# Patient Record
Sex: Female | Born: 1960 | Race: Black or African American | Hispanic: No | Marital: Married | Smoking: Never smoker
Health system: Southern US, Community
[De-identification: ages and names within clinical notes are randomized; demographics above are authoritative.]

## PROBLEM LIST (undated history)

## (undated) DIAGNOSIS — C801 Malignant (primary) neoplasm, unspecified: Secondary | ICD-10-CM

## (undated) DIAGNOSIS — F329 Major depressive disorder, single episode, unspecified: Secondary | ICD-10-CM

## (undated) DIAGNOSIS — I10 Essential (primary) hypertension: Secondary | ICD-10-CM

## (undated) DIAGNOSIS — F419 Anxiety disorder, unspecified: Secondary | ICD-10-CM

## (undated) DIAGNOSIS — F32A Depression, unspecified: Secondary | ICD-10-CM

## (undated) DIAGNOSIS — M1990 Unspecified osteoarthritis, unspecified site: Secondary | ICD-10-CM

## (undated) HISTORY — DX: Malignant (primary) neoplasm, unspecified: C80.1

## (undated) HISTORY — PX: CERVICAL CERCLAGE: SHX1329

## (undated) HISTORY — DX: Essential (primary) hypertension: I10

---

## 1998-05-25 ENCOUNTER — Encounter: Payer: Self-pay | Admitting: Gynecology

## 1998-05-25 ENCOUNTER — Ambulatory Visit (HOSPITAL_COMMUNITY): Admission: RE | Admit: 1998-05-25 | Discharge: 1998-05-25 | Payer: Self-pay | Admitting: Gynecology

## 1998-06-04 ENCOUNTER — Encounter: Payer: Self-pay | Admitting: Gynecology

## 1998-06-04 ENCOUNTER — Ambulatory Visit (HOSPITAL_COMMUNITY): Admission: RE | Admit: 1998-06-04 | Discharge: 1998-06-04 | Payer: Self-pay | Admitting: Gynecology

## 1998-06-05 ENCOUNTER — Ambulatory Visit (HOSPITAL_COMMUNITY): Admission: RE | Admit: 1998-06-05 | Discharge: 1998-06-05 | Payer: Self-pay | Admitting: *Deleted

## 1998-06-09 HISTORY — PX: BREAST SURGERY: SHX581

## 1998-06-13 ENCOUNTER — Encounter: Admission: RE | Admit: 1998-06-13 | Discharge: 1998-09-11 | Payer: Self-pay | Admitting: Radiation Oncology

## 1998-06-27 ENCOUNTER — Ambulatory Visit (HOSPITAL_COMMUNITY): Admission: RE | Admit: 1998-06-27 | Discharge: 1998-06-28 | Payer: Self-pay | Admitting: *Deleted

## 1998-08-02 ENCOUNTER — Ambulatory Visit (HOSPITAL_COMMUNITY): Admission: RE | Admit: 1998-08-02 | Discharge: 1998-08-02 | Payer: Self-pay | Admitting: *Deleted

## 1998-08-08 ENCOUNTER — Ambulatory Visit (HOSPITAL_COMMUNITY): Admission: RE | Admit: 1998-08-08 | Discharge: 1998-08-08 | Payer: Self-pay | Admitting: *Deleted

## 1998-10-23 ENCOUNTER — Encounter: Payer: Self-pay | Admitting: Hematology & Oncology

## 1998-10-23 ENCOUNTER — Emergency Department (HOSPITAL_COMMUNITY): Admission: EM | Admit: 1998-10-23 | Discharge: 1998-10-23 | Payer: Self-pay | Admitting: Emergency Medicine

## 1998-11-07 ENCOUNTER — Encounter: Admission: RE | Admit: 1998-11-07 | Discharge: 1999-02-05 | Payer: Self-pay | Admitting: Radiation Oncology

## 1998-11-14 ENCOUNTER — Ambulatory Visit (HOSPITAL_BASED_OUTPATIENT_CLINIC_OR_DEPARTMENT_OTHER): Admission: RE | Admit: 1998-11-14 | Discharge: 1998-11-14 | Payer: Self-pay | Admitting: *Deleted

## 1999-10-04 ENCOUNTER — Encounter: Payer: Self-pay | Admitting: Hematology & Oncology

## 1999-10-04 ENCOUNTER — Encounter: Admission: RE | Admit: 1999-10-04 | Discharge: 1999-10-04 | Payer: Self-pay | Admitting: Hematology & Oncology

## 2000-10-05 ENCOUNTER — Encounter: Payer: Self-pay | Admitting: Hematology & Oncology

## 2000-10-05 ENCOUNTER — Encounter: Admission: RE | Admit: 2000-10-05 | Discharge: 2000-10-05 | Payer: Self-pay | Admitting: Hematology & Oncology

## 2001-01-06 ENCOUNTER — Encounter: Payer: Self-pay | Admitting: Hematology & Oncology

## 2001-01-06 ENCOUNTER — Ambulatory Visit (HOSPITAL_COMMUNITY): Admission: RE | Admit: 2001-01-06 | Discharge: 2001-01-06 | Payer: Self-pay | Admitting: Hematology & Oncology

## 2001-06-28 ENCOUNTER — Encounter: Admission: RE | Admit: 2001-06-28 | Discharge: 2001-06-28 | Payer: Self-pay | Admitting: Hematology & Oncology

## 2001-06-28 ENCOUNTER — Encounter: Payer: Self-pay | Admitting: Hematology & Oncology

## 2001-10-06 ENCOUNTER — Encounter: Admission: RE | Admit: 2001-10-06 | Discharge: 2001-10-06 | Payer: Self-pay | Admitting: Hematology & Oncology

## 2001-10-06 ENCOUNTER — Encounter: Payer: Self-pay | Admitting: Hematology & Oncology

## 2001-12-17 ENCOUNTER — Other Ambulatory Visit: Admission: RE | Admit: 2001-12-17 | Discharge: 2001-12-17 | Payer: Self-pay | Admitting: Gynecology

## 2002-03-01 ENCOUNTER — Encounter: Payer: Self-pay | Admitting: Gynecology

## 2002-03-01 ENCOUNTER — Ambulatory Visit (HOSPITAL_COMMUNITY): Admission: RE | Admit: 2002-03-01 | Discharge: 2002-03-01 | Payer: Self-pay | Admitting: Gynecology

## 2002-10-10 ENCOUNTER — Encounter: Payer: Self-pay | Admitting: Hematology & Oncology

## 2002-10-10 ENCOUNTER — Encounter: Admission: RE | Admit: 2002-10-10 | Discharge: 2002-10-10 | Payer: Self-pay | Admitting: Hematology & Oncology

## 2003-02-22 ENCOUNTER — Other Ambulatory Visit: Admission: RE | Admit: 2003-02-22 | Discharge: 2003-02-22 | Payer: Self-pay | Admitting: Gynecology

## 2003-06-15 ENCOUNTER — Emergency Department (HOSPITAL_COMMUNITY): Admission: EM | Admit: 2003-06-15 | Discharge: 2003-06-15 | Payer: Self-pay

## 2003-10-12 ENCOUNTER — Encounter: Admission: RE | Admit: 2003-10-12 | Discharge: 2003-10-12 | Payer: Self-pay | Admitting: Hematology & Oncology

## 2004-04-12 ENCOUNTER — Other Ambulatory Visit: Admission: RE | Admit: 2004-04-12 | Discharge: 2004-04-12 | Payer: Self-pay | Admitting: Gynecology

## 2004-10-18 ENCOUNTER — Encounter: Admission: RE | Admit: 2004-10-18 | Discharge: 2004-10-18 | Payer: Self-pay | Admitting: Hematology & Oncology

## 2004-12-09 ENCOUNTER — Ambulatory Visit: Payer: Self-pay | Admitting: Hematology & Oncology

## 2005-06-27 ENCOUNTER — Ambulatory Visit: Payer: Self-pay | Admitting: Hematology & Oncology

## 2005-06-27 ENCOUNTER — Other Ambulatory Visit: Admission: RE | Admit: 2005-06-27 | Discharge: 2005-06-27 | Payer: Self-pay | Admitting: Gynecology

## 2005-09-10 ENCOUNTER — Ambulatory Visit: Payer: Self-pay | Admitting: Hematology & Oncology

## 2005-09-11 LAB — CBC WITH DIFFERENTIAL/PLATELET
Basophils Absolute: 0 10*3/uL (ref 0.0–0.1)
Eosinophils Absolute: 0.1 10*3/uL (ref 0.0–0.5)
HGB: 12.5 g/dL (ref 11.6–15.9)
MONO#: 0.5 10*3/uL (ref 0.1–0.9)
MONO%: 7.1 % (ref 0.0–13.0)
NEUT#: 4.2 10*3/uL (ref 1.5–6.5)
RBC: 4.41 10*6/uL (ref 3.70–5.32)
RDW: 14.3 % (ref 11.3–14.5)
WBC: 6.6 10*3/uL (ref 3.9–10.0)
lymph#: 1.8 10*3/uL (ref 0.9–3.3)

## 2005-09-11 LAB — COMPREHENSIVE METABOLIC PANEL
Albumin: 3.8 g/dL (ref 3.5–5.2)
Alkaline Phosphatase: 103 U/L (ref 39–117)
BUN: 10 mg/dL (ref 6–23)
Calcium: 9.4 mg/dL (ref 8.4–10.5)
Chloride: 101 mEq/L (ref 96–112)
Glucose, Bld: 94 mg/dL (ref 70–99)
Potassium: 3.9 mEq/L (ref 3.5–5.3)
Sodium: 140 mEq/L (ref 135–145)
Total Protein: 7.5 g/dL (ref 6.0–8.3)

## 2005-10-22 ENCOUNTER — Encounter: Admission: RE | Admit: 2005-10-22 | Discharge: 2005-10-22 | Payer: Self-pay | Admitting: Hematology & Oncology

## 2006-07-07 ENCOUNTER — Other Ambulatory Visit: Admission: RE | Admit: 2006-07-07 | Discharge: 2006-07-07 | Payer: Self-pay | Admitting: Gynecology

## 2006-09-07 ENCOUNTER — Ambulatory Visit: Payer: Self-pay | Admitting: Hematology & Oncology

## 2006-09-10 LAB — COMPREHENSIVE METABOLIC PANEL
AST: 13 U/L (ref 0–37)
Albumin: 3.9 g/dL (ref 3.5–5.2)
Alkaline Phosphatase: 114 U/L (ref 39–117)
Calcium: 8.9 mg/dL (ref 8.4–10.5)
Chloride: 102 mEq/L (ref 96–112)
Potassium: 3.5 mEq/L (ref 3.5–5.3)
Sodium: 139 mEq/L (ref 135–145)
Total Protein: 7.5 g/dL (ref 6.0–8.3)

## 2006-09-10 LAB — CBC WITH DIFFERENTIAL/PLATELET
EOS%: 1.9 % (ref 0.0–7.0)
Eosinophils Absolute: 0.2 10*3/uL (ref 0.0–0.5)
HGB: 12.7 g/dL (ref 11.6–15.9)
MCH: 28.2 pg (ref 26.0–34.0)
MCV: 82.4 fL (ref 81.0–101.0)
MONO%: 6.2 % (ref 0.0–13.0)
NEUT#: 5.6 10*3/uL (ref 1.5–6.5)
RBC: 4.49 10*6/uL (ref 3.70–5.32)
RDW: 15 % — ABNORMAL HIGH (ref 11.3–14.5)
lymph#: 1.7 10*3/uL (ref 0.9–3.3)

## 2006-10-26 ENCOUNTER — Encounter: Admission: RE | Admit: 2006-10-26 | Discharge: 2006-10-26 | Payer: Self-pay | Admitting: Hematology & Oncology

## 2007-04-20 ENCOUNTER — Encounter: Admission: RE | Admit: 2007-04-20 | Discharge: 2007-04-20 | Payer: Self-pay | Admitting: Hematology & Oncology

## 2007-04-20 ENCOUNTER — Encounter (INDEPENDENT_AMBULATORY_CARE_PROVIDER_SITE_OTHER): Payer: Self-pay | Admitting: Diagnostic Radiology

## 2007-05-24 ENCOUNTER — Encounter (INDEPENDENT_AMBULATORY_CARE_PROVIDER_SITE_OTHER): Payer: Self-pay | Admitting: Surgery

## 2007-05-24 ENCOUNTER — Encounter: Admission: RE | Admit: 2007-05-24 | Discharge: 2007-05-24 | Payer: Self-pay | Admitting: Surgery

## 2007-05-24 ENCOUNTER — Ambulatory Visit (HOSPITAL_COMMUNITY): Admission: RE | Admit: 2007-05-24 | Discharge: 2007-05-24 | Payer: Self-pay | Admitting: Surgery

## 2007-07-21 ENCOUNTER — Other Ambulatory Visit: Admission: RE | Admit: 2007-07-21 | Discharge: 2007-07-21 | Payer: Self-pay | Admitting: Gynecology

## 2007-09-07 ENCOUNTER — Ambulatory Visit: Payer: Self-pay | Admitting: Hematology & Oncology

## 2007-09-09 LAB — COMPREHENSIVE METABOLIC PANEL
Albumin: 3.8 g/dL (ref 3.5–5.2)
Alkaline Phosphatase: 95 U/L (ref 39–117)
BUN: 11 mg/dL (ref 6–23)
CO2: 28 mEq/L (ref 19–32)
Calcium: 9.1 mg/dL (ref 8.4–10.5)
Glucose, Bld: 96 mg/dL (ref 70–99)
Potassium: 3.7 mEq/L (ref 3.5–5.3)
Sodium: 139 mEq/L (ref 135–145)
Total Protein: 7.2 g/dL (ref 6.0–8.3)

## 2007-09-09 LAB — CBC WITH DIFFERENTIAL/PLATELET
Basophils Absolute: 0 10*3/uL (ref 0.0–0.1)
Eosinophils Absolute: 0.1 10*3/uL (ref 0.0–0.5)
HGB: 12.4 g/dL (ref 11.6–15.9)
MCV: 82.8 fL (ref 81.0–101.0)
MONO#: 0.4 10*3/uL (ref 0.1–0.9)
MONO%: 5 % (ref 0.0–13.0)
NEUT#: 5.4 10*3/uL (ref 1.5–6.5)
Platelets: 335 10*3/uL (ref 145–400)
RBC: 4.41 10*6/uL (ref 3.70–5.32)
RDW: 14.1 % (ref 11.3–14.5)
WBC: 8 10*3/uL (ref 3.9–10.0)

## 2007-11-05 ENCOUNTER — Encounter: Admission: RE | Admit: 2007-11-05 | Discharge: 2007-11-05 | Payer: Self-pay | Admitting: Internal Medicine

## 2008-09-06 ENCOUNTER — Ambulatory Visit: Payer: Self-pay | Admitting: Hematology & Oncology

## 2008-09-18 ENCOUNTER — Other Ambulatory Visit: Admission: RE | Admit: 2008-09-18 | Discharge: 2008-09-18 | Payer: Self-pay | Admitting: Gynecology

## 2008-09-18 ENCOUNTER — Encounter: Payer: Self-pay | Admitting: Gynecology

## 2008-09-18 ENCOUNTER — Ambulatory Visit: Payer: Self-pay | Admitting: Gynecology

## 2009-02-15 ENCOUNTER — Encounter: Admission: RE | Admit: 2009-02-15 | Discharge: 2009-02-15 | Payer: Self-pay | Admitting: Gynecology

## 2009-10-31 ENCOUNTER — Ambulatory Visit: Payer: Self-pay | Admitting: Gynecology

## 2009-10-31 ENCOUNTER — Other Ambulatory Visit: Admission: RE | Admit: 2009-10-31 | Discharge: 2009-10-31 | Payer: Self-pay | Admitting: Gynecology

## 2010-03-14 ENCOUNTER — Encounter: Admission: RE | Admit: 2010-03-14 | Discharge: 2010-03-14 | Payer: Self-pay | Admitting: Internal Medicine

## 2010-03-26 ENCOUNTER — Encounter: Admission: RE | Admit: 2010-03-26 | Discharge: 2010-03-26 | Payer: Self-pay | Admitting: Internal Medicine

## 2010-04-09 ENCOUNTER — Encounter: Admission: RE | Admit: 2010-04-09 | Discharge: 2010-04-09 | Payer: Self-pay | Admitting: Internal Medicine

## 2010-04-17 ENCOUNTER — Ambulatory Visit: Payer: Self-pay | Admitting: Hematology & Oncology

## 2010-04-22 ENCOUNTER — Ambulatory Visit: Payer: Self-pay | Admitting: Genetic Counselor

## 2010-05-09 HISTORY — PX: MASTECTOMY: SHX3

## 2010-05-23 ENCOUNTER — Encounter (INDEPENDENT_AMBULATORY_CARE_PROVIDER_SITE_OTHER): Payer: Self-pay | Admitting: Surgery

## 2010-05-23 ENCOUNTER — Ambulatory Visit (HOSPITAL_COMMUNITY)
Admission: RE | Admit: 2010-05-23 | Discharge: 2010-05-25 | Payer: Self-pay | Source: Home / Self Care | Attending: Surgery | Admitting: Surgery

## 2010-06-29 ENCOUNTER — Encounter: Payer: Self-pay | Admitting: Internal Medicine

## 2010-06-30 ENCOUNTER — Encounter: Payer: Self-pay | Admitting: Internal Medicine

## 2010-07-01 ENCOUNTER — Encounter: Payer: Self-pay | Admitting: Gynecology

## 2010-07-18 ENCOUNTER — Encounter (HOSPITAL_BASED_OUTPATIENT_CLINIC_OR_DEPARTMENT_OTHER): Payer: Managed Care, Other (non HMO) | Admitting: Hematology & Oncology

## 2010-07-18 DIAGNOSIS — D0590 Unspecified type of carcinoma in situ of unspecified breast: Secondary | ICD-10-CM

## 2010-07-18 DIAGNOSIS — Z853 Personal history of malignant neoplasm of breast: Secondary | ICD-10-CM

## 2010-08-20 LAB — SURGICAL PCR SCREEN: Staphylococcus aureus: NEGATIVE

## 2010-08-20 LAB — COMPREHENSIVE METABOLIC PANEL
ALT: 9 U/L (ref 0–35)
AST: 13 U/L (ref 0–37)
Albumin: 3.8 g/dL (ref 3.5–5.2)
Alkaline Phosphatase: 132 U/L — ABNORMAL HIGH (ref 39–117)
BUN: 8 mg/dL (ref 6–23)
CO2: 32 mEq/L (ref 19–32)
Calcium: 9.5 mg/dL (ref 8.4–10.5)
Chloride: 99 mEq/L (ref 96–112)
Creatinine, Ser: 0.82 mg/dL (ref 0.4–1.2)
GFR calc Af Amer: >60 mL/min (ref >60)
GFR calc non Af Amer: >60 mL/min (ref >60)
Glucose, Bld: 91 mg/dL (ref 70–99)
Potassium: 3.8 mEq/L (ref 3.5–5.1)
Sodium: 138 mEq/L (ref 135–145)
Total Bilirubin: 0.6 mg/dL (ref 0.3–1.2)
Total Protein: 8.2 g/dL (ref 6.0–8.3)

## 2010-08-20 LAB — CBC
HCT: 39.8 % (ref 36.0–46.0)
Hemoglobin: 12.6 g/dL (ref 12.0–15.0)
MCH: 27.1 pg (ref 26.0–34.0)
MCHC: 31.7 g/dL (ref 30.0–36.0)
MCV: 85.6 fL (ref 78.0–100.0)
Platelets: 328 10*3/uL (ref 150–400)
RBC: 4.65 MIL/uL (ref 3.87–5.11)
RDW: 13.3 % (ref 11.5–15.5)
WBC: 7.1 10*3/uL (ref 4.0–10.5)

## 2010-08-20 LAB — DIFFERENTIAL
Basophils Absolute: 0 10*3/uL (ref 0.0–0.1)
Basophils Relative: 0 % (ref 0–1)
Eosinophils Absolute: 0.1 10*3/uL (ref 0.0–0.7)
Eosinophils Relative: 1 % (ref 0–5)
Lymphocytes Relative: 38 % (ref 12–46)
Lymphs Abs: 2.7 10*3/uL (ref 0.7–4.0)
Monocytes Absolute: 0.3 10*3/uL (ref 0.1–1.0)
Monocytes Relative: 5 % (ref 3–12)
Neutro Abs: 4 10*3/uL (ref 1.7–7.7)
Neutrophils Relative %: 56 % (ref 43–77)

## 2010-08-20 LAB — HCG, SERUM, QUALITATIVE: Preg, Serum: NEGATIVE

## 2010-10-22 NOTE — Op Note (Signed)
Diane Barnes, Diane Barnes                ACCOUNT NO.:  [id number]   MEDICAL RECORD NO.:  [id number]          PATIENT TYPE:  AMB   LOCATION:  SDS                          FACILITY:  [facility]   PHYSICIAN:  Thomas A. Cornett, M.D.DATE OF BIRTH:  [birth year]   DATE OF PROCEDURE:  05/24/2007  DATE OF DISCHARGE:                               OPERATIVE REPORT   PREOPERATIVE DIAGNOSIS:  Right breast atypical ductal hyperplasia.   POSTOPERATIVE DIAGNOSIS:  Right breast atypical ductal hyperplasia.   PROCEDURE:  Right breast needle-localized excisional biopsy.   SURGEON:  Harriette Bouillon, M.D.   ASSISTANT:  None.   ANESTHESIA:  MAC with 0.25% Sensorcaine with epinephrine for local  anesthesia.   ESTIMATED BLOOD LOSS:  20 mL.   SPECIMEN:  Right breast tissue with localizing wire.  Previously placed  biopsy clip found to be adequate by specimen radiograph.   INDICATIONS FOR PROCEDURE:  The patient is a 50 year old female who had  a right breast biopsy by percutaneous means.  The pathology came back  with atypical ductal hyperplasia and excisional biopsy recommended.   DESCRIPTION OF PROCEDURE:  After undergoing right breast needle  localization, the patient brought to the operating room, placed supine.  After induction of MAC anesthesia the right breast was prepped and  draped in sterile fashion.  The wire exited the upper central portion of  the breast.  0.25% Sensorcaine was infiltrated into this area and a  incision was made in the upper central breast approximately two  fingerbreadths above the nipple areolar complex.  Localizing wire was  pulled out of the incision.  Tissue around the tip of the wire was  excised and the entire specimen was sent to radiography for image.  The  image showed the tissue to be adequate with the entire wire  and clip within the specimen.  Irrigation was used and suctioned out.  The wound was closed in layers using a deep layer of 3-0 Vicryl and 4-0  Monocryl in  a subcuticular stitch.  Steri-Strips and dry dressings were  applied.  The patient was awoke and taken to recovery in satisfactory  condition.      Thomas A. Cornett, M.D.  Electronically Signed     TAC/MEDQ  D:  05/24/2007  T:  05/25/2007  Job:  [id number]   cc:   Dr. Aloha Gell. Card  Oliver Hum, MD

## 2010-11-05 ENCOUNTER — Encounter: Payer: Managed Care, Other (non HMO) | Admitting: Gynecology

## 2010-11-28 ENCOUNTER — Other Ambulatory Visit (HOSPITAL_COMMUNITY)
Admission: RE | Admit: 2010-11-28 | Discharge: 2010-11-28 | Disposition: A | Discharge status: Home / Self Care | Payer: Managed Care, Other (non HMO) | Source: Ambulatory Visit | Attending: Gynecology | Admitting: Gynecology

## 2010-11-28 ENCOUNTER — Other Ambulatory Visit: Payer: Self-pay | Admitting: Gynecology

## 2010-11-28 ENCOUNTER — Encounter (INDEPENDENT_AMBULATORY_CARE_PROVIDER_SITE_OTHER): Payer: Managed Care, Other (non HMO) | Admitting: Gynecology

## 2010-11-28 DIAGNOSIS — N926 Irregular menstruation, unspecified: Secondary | ICD-10-CM

## 2010-11-28 DIAGNOSIS — Z124 Encounter for screening for malignant neoplasm of cervix: Secondary | ICD-10-CM | POA: Insufficient documentation

## 2010-11-28 DIAGNOSIS — Z01419 Encounter for gynecological examination (general) (routine) without abnormal findings: Secondary | ICD-10-CM

## 2011-03-17 LAB — DIFFERENTIAL
Basophils Absolute: 0
Eosinophils Relative: 2
Lymphocytes Relative: 23
Neutro Abs: 6.2
Neutrophils Relative %: 70

## 2011-03-17 LAB — BASIC METABOLIC PANEL
BUN: 11
Calcium: 9.2
Creatinine, Ser: 0.86
GFR calc non Af Amer: >60
Glucose, Bld: 92

## 2011-03-17 LAB — PROTIME-INR
INR: 0.9
Prothrombin Time: 12.3

## 2011-03-17 LAB — CBC
Platelets: 352
RDW: 14.6

## 2011-04-02 ENCOUNTER — Other Ambulatory Visit: Payer: Self-pay | Admitting: Internal Medicine

## 2011-04-02 DIAGNOSIS — Z9012 Acquired absence of left breast and nipple: Secondary | ICD-10-CM

## 2011-04-02 DIAGNOSIS — Z1231 Encounter for screening mammogram for malignant neoplasm of breast: Secondary | ICD-10-CM

## 2011-05-06 ENCOUNTER — Ambulatory Visit
Admission: RE | Admit: 2011-05-06 | Discharge: 2011-05-06 | Disposition: A | Discharge status: Home / Self Care | Payer: Managed Care, Other (non HMO) | Source: Ambulatory Visit | Attending: Internal Medicine | Admitting: Internal Medicine

## 2011-05-06 DIAGNOSIS — Z9012 Acquired absence of left breast and nipple: Secondary | ICD-10-CM

## 2011-05-06 DIAGNOSIS — Z1231 Encounter for screening mammogram for malignant neoplasm of breast: Secondary | ICD-10-CM

## 2011-05-07 ENCOUNTER — Encounter (INDEPENDENT_AMBULATORY_CARE_PROVIDER_SITE_OTHER): Payer: Self-pay | Admitting: General Surgery

## 2011-05-09 ENCOUNTER — Encounter (INDEPENDENT_AMBULATORY_CARE_PROVIDER_SITE_OTHER): Payer: Self-pay | Admitting: Surgery

## 2011-05-09 ENCOUNTER — Ambulatory Visit (INDEPENDENT_AMBULATORY_CARE_PROVIDER_SITE_OTHER): Payer: Managed Care, Other (non HMO) | Admitting: Surgery

## 2011-05-09 VITALS — BP 128/80 | HR 70 | Temp 97.4°F | Resp 16 | Ht 64.0 in | Wt 271.0 lb

## 2011-05-09 DIAGNOSIS — Z853 Personal history of malignant neoplasm of breast: Secondary | ICD-10-CM

## 2011-05-09 NOTE — Patient Instructions (Addendum)
Dr Etter Sjogren   Plastic surgeon.   [phone]  Dr Alan Ripper [facility]  Plastic Surgeon     [phone]   Follow up in 6 months.  Aloe vera and vitamin e for skin.

## 2011-05-09 NOTE — Progress Notes (Signed)
Subjective:     Patient ID: Diane Barnes, female   DOB: [birth year], 50 y.o.   MRN: [id number]  HPI The patient returns today for followup. 2 history of left breast DCIS. She underwent a left mastectomy in December 2011. She history of stage II left breast cancer after breast conservation surgery radiation therapy in 1998. She complains of some tightness along her incision. She also has left arm lymphedema and is now in a sleeve. She is no better or worse.  Review of Systems  Constitutional: Negative for fever, chills and unexpected weight change.  HENT: Negative for hearing loss, congestion, sore throat, trouble swallowing and voice change.   Eyes: Negative for visual disturbance.  Respiratory: Negative for cough and wheezing.   Cardiovascular: Negative for chest pain, palpitations and leg swelling.  Gastrointestinal: Negative for nausea, vomiting, abdominal pain, diarrhea, constipation, blood in stool, abdominal distention and anal bleeding.  Genitourinary: Negative for hematuria, vaginal bleeding and difficulty urinating.  Musculoskeletal: Negative for arthralgias.  Skin: Negative for rash and wound.  Neurological: Negative for seizures, syncope and headaches.  Hematological: Negative for adenopathy. Does not bruise/bleed easily.  Psychiatric/Behavioral: Negative for confusion.       Objective:   Physical Exam  Constitutional: She appears well-developed and well-nourished.  HENT:  Head: Normocephalic and atraumatic.  Neck: Normal range of motion. Neck supple.  Pulmonary/Chest: Effort normal and breath sounds normal.       Left breast surgically absent. No masses. No left axillary mass. Mild left arm lymphedema. Right breast large and pendulous. No mass. Scar noted. Axilla normal  Musculoskeletal: She exhibits edema.  Skin: Skin is warm and dry.       Assessment:     History left breast cancer and DCIS  Mammogram November 2012 new suspicious lesion noted    Plan:     The  patient is doing okay with her lymphedema. I offered further physical therapy. She declined for now. She likes see a Engineer, petroleum for breast reduction. Overall, she is stable and will return to see me in 6 months.

## 2011-09-17 ENCOUNTER — Encounter (INDEPENDENT_AMBULATORY_CARE_PROVIDER_SITE_OTHER): Payer: Self-pay | Admitting: Surgery

## 2011-12-04 ENCOUNTER — Encounter: Payer: Self-pay | Admitting: Gynecology

## 2011-12-04 ENCOUNTER — Ambulatory Visit (INDEPENDENT_AMBULATORY_CARE_PROVIDER_SITE_OTHER): Payer: Managed Care, Other (non HMO) | Admitting: Gynecology

## 2011-12-04 VITALS — BP 120/70 | Ht 64.0 in | Wt 241.0 lb

## 2011-12-04 DIAGNOSIS — Z780 Asymptomatic menopausal state: Secondary | ICD-10-CM

## 2011-12-04 DIAGNOSIS — Z01419 Encounter for gynecological examination (general) (routine) without abnormal findings: Secondary | ICD-10-CM

## 2011-12-04 NOTE — Patient Instructions (Signed)
Followup for bone density as scheduled. 

## 2011-12-04 NOTE — Progress Notes (Signed)
Diane Barnes [birth year] [id number]        51 y.o.  for annual exam.  Without complaints.  Past medical history,surgical history, medications, allergies, family history and social history were all reviewed and documented in the EPIC chart. ROS:  Was performed and pertinent positives and negatives are included in the history.  Exam: Diane Barnes assistant Filed Vitals:   12/04/11 1036  BP: 120/70   General appearance  Normal Skin grossly normal Head/Neck normal with no cervical or supraclavicular adenopathy thyroid normal Lungs  clear Cardiac RR, without RMG Abdominal  soft, nontender, without masses, organomegaly or hernia Breasts  examined lying and sitting. Right without masses, retractions, discharge or axillary adenopathy.  Left status post mastectomy with well-healed scars no masses/adenopathy. Pelvic  Ext/BUS/vagina  normal mild atrophic changes  Cervix  normal   Uterus  axial, normal size, shape and contour, midline and mobile nontender   Adnexa  Without masses or tenderness    Anus and perineum  normal   Rectovaginal  normal sphincter tone without palpated masses or tenderness.    Assessment/Plan:  51 y.o. female for annual exam.    1. Menopausal without menses greater than one year. Elevated FSH in the past. Without significant hot flashes/night sweats. We'll continue to monitor. 2. Postmenopausal, status post chemotherapy. We'll get baseline DEXA. Increase calcium vitamin D reviewed. 3. Pap smear. Pap 2012 normal. No history of abnormal Pap smears before. No Pap smear done today. We'll plan screening every 3-5 years. 4. Mammography. Patients do this fall and she knows to follow up for this. SBE monthly reviewed. 5. Colonoscopy. Patient due now she knows to schedule this. 6. Health maintenance. No lab work was done as it is all done through her primary physician's office.    Diane Lords MD, 11:05 AM 12/04/2011

## 2012-04-12 ENCOUNTER — Other Ambulatory Visit: Payer: Self-pay | Admitting: Internal Medicine

## 2012-04-12 DIAGNOSIS — Z1231 Encounter for screening mammogram for malignant neoplasm of breast: Secondary | ICD-10-CM

## 2012-05-20 ENCOUNTER — Ambulatory Visit
Admission: RE | Admit: 2012-05-20 | Discharge: 2012-05-20 | Disposition: A | Discharge status: Home / Self Care | Payer: Managed Care, Other (non HMO) | Source: Ambulatory Visit | Attending: Internal Medicine | Admitting: Internal Medicine

## 2012-05-20 DIAGNOSIS — Z1231 Encounter for screening mammogram for malignant neoplasm of breast: Secondary | ICD-10-CM

## 2013-04-15 ENCOUNTER — Other Ambulatory Visit: Payer: Self-pay

## 2013-04-15 DIAGNOSIS — Z853 Personal history of malignant neoplasm of breast: Secondary | ICD-10-CM

## 2013-04-15 DIAGNOSIS — Z9012 Acquired absence of left breast and nipple: Secondary | ICD-10-CM

## 2013-04-15 DIAGNOSIS — Z1231 Encounter for screening mammogram for malignant neoplasm of breast: Secondary | ICD-10-CM

## 2013-06-21 ENCOUNTER — Ambulatory Visit
Admission: RE | Admit: 2013-06-21 | Discharge: 2013-06-21 | Disposition: A | Discharge status: Home / Self Care | Payer: Managed Care, Other (non HMO) | Source: Ambulatory Visit

## 2013-06-21 DIAGNOSIS — Z1231 Encounter for screening mammogram for malignant neoplasm of breast: Secondary | ICD-10-CM

## 2013-06-21 DIAGNOSIS — Z853 Personal history of malignant neoplasm of breast: Secondary | ICD-10-CM

## 2013-06-21 DIAGNOSIS — Z9012 Acquired absence of left breast and nipple: Secondary | ICD-10-CM

## 2013-08-03 ENCOUNTER — Other Ambulatory Visit (INDEPENDENT_AMBULATORY_CARE_PROVIDER_SITE_OTHER): Payer: Self-pay | Admitting: *Deleted

## 2013-08-03 MED ORDER — UNABLE TO FIND: Status: DC

## 2014-03-11 ENCOUNTER — Encounter (HOSPITAL_COMMUNITY): Payer: Self-pay | Admitting: Emergency Medicine

## 2014-03-11 ENCOUNTER — Emergency Department (HOSPITAL_COMMUNITY)
Admission: EM | Admit: 2014-03-11 | Discharge: 2014-03-11 | Disposition: A | Discharge status: Home / Self Care | Payer: Managed Care, Other (non HMO) | Attending: Emergency Medicine | Admitting: Emergency Medicine

## 2014-03-11 DIAGNOSIS — Z79899 Other long term (current) drug therapy: Secondary | ICD-10-CM | POA: Diagnosis not present

## 2014-03-11 DIAGNOSIS — S6992XA Unspecified injury of left wrist, hand and finger(s), initial encounter: Secondary | ICD-10-CM | POA: Diagnosis present

## 2014-03-11 DIAGNOSIS — Y9389 Activity, other specified: Secondary | ICD-10-CM | POA: Insufficient documentation

## 2014-03-11 DIAGNOSIS — Z853 Personal history of malignant neoplasm of breast: Secondary | ICD-10-CM | POA: Diagnosis not present

## 2014-03-11 DIAGNOSIS — S61218A Laceration without foreign body of other finger without damage to nail, initial encounter: Secondary | ICD-10-CM | POA: Diagnosis not present

## 2014-03-11 DIAGNOSIS — I10 Essential (primary) hypertension: Secondary | ICD-10-CM | POA: Diagnosis not present

## 2014-03-11 DIAGNOSIS — Y9289 Other specified places as the place of occurrence of the external cause: Secondary | ICD-10-CM | POA: Insufficient documentation

## 2014-03-11 DIAGNOSIS — S61219A Laceration without foreign body of unspecified finger without damage to nail, initial encounter: Secondary | ICD-10-CM

## 2014-03-11 DIAGNOSIS — Y288XXA Contact with other sharp object, undetermined intent, initial encounter: Secondary | ICD-10-CM | POA: Diagnosis not present

## 2014-03-11 MED ORDER — TETANUS-DIPHTH-ACELL PERTUSSIS 5-2.5-18.5 LF-MCG/0.5 IM SUSP: 0.5000 mL | Freq: Once | INTRAMUSCULAR | Status: DC

## 2014-03-11 NOTE — ED Provider Notes (Signed)
CSN: [id number]     Arrival date & time 03/11/14  2043 History  This chart was scribed for non-physician practitioner working with Fredia Sorrow, MD by Mercy Moore, ED Scribe. This patient was seen in room TR07C/TR07C and the patient's care was started at 9:40 PM.   Chief Complaint  Patient presents with  . Finger Injury    The history is provided by the patient. No language interpreter was used.   HPI Comments: Diane Barnes is a 53 y.o. female who presents to the Emergency Department with left first finger injury incurred tonight while attempting to adjust a metal folding chair two hours ago. Patient reports reaching down to pull the chair closer to the desk at which she was sitting and sliding her finger across sharp piece of metal. Patient presents with laceration to distal aspect of finger. Her bleeding is controlled. Patient denies pain currently. Last tetanus shot in 2010.  Past Medical History  Diagnosis Date  . Cancer 1999, 2011    BREAST CA X 2 (LEFT BR BOTH TIMES)  . Hypertension    Past Surgical History  Procedure Laterality Date  . Cervical cerclage    . Mastectomy  05/2010    LEFT BREAST  . Breast surgery  2000    LUMPECTOMY, RAD & CHEMO--MASTECTOMY IN 2011   Family History  Problem Relation Age of Onset  . Diabetes Mother   . Hypertension Mother   . Hypertension Father   . Diabetes Sister   . Cancer Sister     thyroid cancer  . Diabetes Sister   . Diabetes Sister    History  Substance Use Topics  . Smoking status: Never Smoker   . Smokeless tobacco: Never Used  . Alcohol Use: No   OB History   Grav Para Term Preterm Abortions TAB SAB Ect Mult Living   5 1  1 4  4    0     Review of Systems  Constitutional: Negative for fever and chills.  Skin: Positive for wound.       Laceration  Neurological: Negative for weakness and numbness.  All other systems reviewed and are negative.  Allergies  Sesame oil  Home Medications   Prior to Admission  medications   Medication Sig Start Date End Date Taking? Authorizing Provider  amLODipine (NORVASC) 10 MG tablet Take 10 mg by mouth daily.      Historical Provider, MD  Calcium Carbonate-Vitamin D (CALCIUM + D PO) Take by mouth.    Historical Provider, MD  Cholecalciferol (VITAMIN D) 2000 UNITS tablet Take 2,000 Units by mouth daily.    Historical Provider, MD  EPINEPHrine (EPIPEN IJ) Inject as directed.    Historical Provider, MD  Multiple Vitamins-Iron (MULTIVITAMIN/IRON PO) Take by mouth.      Historical Provider, MD  triamterene-hydrochlorothiazide (MAXZIDE-25) 37.5-25 MG per tablet Take 1 tablet by mouth daily.      Historical Provider, MD  UNABLE TO FIND Rx: B7169- Silicone Breast prosthesis; left (Quantity: 1) Dx: 174.9; Left mastectomy 08/03/13   Erroll Luna, MD   Triage Vitals: BP 155/72  Pulse 64  Temp(Src) 97.7 F (36.5 C) (Oral)  Resp 18  SpO2 95%  Physical Exam  Nursing note and vitals reviewed. Constitutional: She is oriented to person, place, and time. She appears well-developed and well-nourished. No distress.  HENT:  Head: Normocephalic and atraumatic.  Eyes: EOM are normal.  Neck: Neck supple. No tracheal deviation present.  Cardiovascular: Normal rate.   Pulmonary/Chest: Effort normal. No  respiratory distress.  Musculoskeletal: Normal range of motion.  Neurological: She is alert and oriented to person, place, and time.  Skin: Skin is warm and dry.  2cm lac from the nail bed of left 1st finger extending to the palmar distal phalanx.  5/5 strength with flexion and extension. No alterations in sensation. Warm distal to laceration.   Psychiatric: She has a normal mood and affect. Her behavior is normal.    ED Course  Procedures (including critical care time) LACERATION REPAIR Performed by: Britt Bottom Authorized by: Britt Bottom Consent: Verbal consent obtained. Risks and benefits: risks, benefits and alternatives were discussed Consent  given by: patient Patient identity confirmed: provided demographic data Prepped and Draped in normal clean fashion Wound explored  Laceration Location: 1st finger, left hand  Laceration Length: 2 cm  No Foreign Bodies seen or palpated  Anesthesia: none  Local anesthetic: N/A  Anesthetic total: 0 ml  Irrigation method: syringe Amount of cleaning: standard  Skin closure: dermabond  Number of sutures: 0  Technique: skin adhesive  Patient tolerance: Patient tolerated the procedure well with no immediate complications.   COORDINATION OF CARE: 9:40 PM- Discussed treatment plan with patient at bedside and patient agreed to plan.   Labs Review Labs Reviewed - No data to display  Imaging Review No results found.   EKG Interpretation None      MDM   Final diagnoses:  Finger laceration, initial encounter   53 yo female with laceration to finger occurring 2 hours ago.  Discussed the utility of possibly 2 sutures.  Pt reports would prefer Dermabond.  Laceration appears amenable to skin adhesive closure. Wound irrigation performed. Tdap UTD. Pt has no co morbidities to effect normal wound healing. Pt tolerated procedure well.  Discussed post adhesive home care w pt and answered questions. Pt is hemodynamically stable w no complaints prior to dc.  Discharge instructions include follow-up in 7 days for wound re-check.  Pt in agreement with plan. Return precautions provided.     Filed Vitals:   03/11/14 2052 03/11/14 2301  BP: 155/72 141/71  Pulse: 64 56  Temp: 97.7 F (36.5 C) 98.5 F (36.9 C)  TempSrc: Oral Oral  Resp: 18 16  SpO2: 95% 99%   Meds given in ED:  Medications - No data to display  Discharge Medication List as of 03/11/2014 10:54 PM      I personally performed the services described in this documentation, which was scribed in my presence. The recorded information has been reviewed and is accurate.    Britt Bottom, NP 03/16/14 1220

## 2014-03-11 NOTE — ED Notes (Signed)
NP applying dermabond to laceration

## 2014-03-11 NOTE — Discharge Instructions (Signed)
Please follow the directions provided. He should follow this week with your primary care provider to ensure wound is healing without signs of infection which include redness, pain, swelling or drainage. He may take ibuprofen 400 mg by mouth every 6 hours for pain don't hesitate to return if you agree new or worsening concerns.  SEEK IMMEDIATE MEDICAL CARE IF:  Your wound becomes red, swollen, hot, or tender.  You develop a rash after the glue is applied.  You have increasing pain in the wound.  You have a red streak that goes away from the wound.  You have pus coming from the wound.  You have increased bleeding.  You have a fever.  You have shaking chills.  You notice a bad smell coming from the wound.  Your wound or adhesive breaks open.

## 2014-03-11 NOTE — ED Notes (Signed)
Patient here with complaint of left 1st finger injury. States that she was sitting in metal folding chair, when she attempted to adjust her finger became cause, and was cut deeply. Currently bleeding controlled. Edges of wound well approximated but jagged. Denies other injury.

## 2014-03-16 NOTE — ED Provider Notes (Signed)
Medical screening examination/treatment/procedure(s) were performed by non-physician practitioner and as supervising physician I was immediately available for consultation/collaboration.   EKG Interpretation None        Fredia Sorrow, MD 03/16/14 2103

## 2014-04-10 ENCOUNTER — Encounter (HOSPITAL_COMMUNITY): Payer: Self-pay | Admitting: Emergency Medicine

## 2015-08-31 ENCOUNTER — Other Ambulatory Visit: Payer: Self-pay

## 2015-08-31 DIAGNOSIS — Z9012 Acquired absence of left breast and nipple: Secondary | ICD-10-CM

## 2015-08-31 DIAGNOSIS — Z1231 Encounter for screening mammogram for malignant neoplasm of breast: Secondary | ICD-10-CM

## 2015-10-01 ENCOUNTER — Ambulatory Visit
Admission: RE | Admit: 2015-10-01 | Discharge: 2015-10-01 | Disposition: A | Discharge status: Home / Self Care | Payer: Managed Care, Other (non HMO) | Source: Ambulatory Visit

## 2015-10-01 DIAGNOSIS — Z9012 Acquired absence of left breast and nipple: Secondary | ICD-10-CM

## 2015-10-01 DIAGNOSIS — Z1231 Encounter for screening mammogram for malignant neoplasm of breast: Secondary | ICD-10-CM

## 2015-10-02 ENCOUNTER — Other Ambulatory Visit: Payer: Self-pay | Admitting: Internal Medicine

## 2015-10-03 ENCOUNTER — Other Ambulatory Visit: Payer: Self-pay | Admitting: Internal Medicine

## 2015-10-08 ENCOUNTER — Other Ambulatory Visit: Payer: Self-pay | Admitting: Internal Medicine

## 2015-10-08 DIAGNOSIS — R928 Other abnormal and inconclusive findings on diagnostic imaging of breast: Secondary | ICD-10-CM

## 2015-10-15 ENCOUNTER — Ambulatory Visit
Admission: RE | Admit: 2015-10-15 | Discharge: 2015-10-15 | Disposition: A | Discharge status: Home / Self Care | Payer: Managed Care, Other (non HMO) | Source: Ambulatory Visit | Attending: Internal Medicine | Admitting: Internal Medicine

## 2015-10-15 ENCOUNTER — Other Ambulatory Visit: Payer: Self-pay | Admitting: Internal Medicine

## 2015-10-15 DIAGNOSIS — R928 Other abnormal and inconclusive findings on diagnostic imaging of breast: Secondary | ICD-10-CM

## 2015-10-22 ENCOUNTER — Other Ambulatory Visit: Payer: Self-pay | Admitting: Internal Medicine

## 2015-10-22 DIAGNOSIS — R928 Other abnormal and inconclusive findings on diagnostic imaging of breast: Secondary | ICD-10-CM

## 2015-10-29 ENCOUNTER — Inpatient Hospital Stay: Admission: RE | Admit: 2015-10-29 | Payer: Managed Care, Other (non HMO) | Source: Ambulatory Visit

## 2015-11-01 ENCOUNTER — Ambulatory Visit
Admission: RE | Admit: 2015-11-01 | Discharge: 2015-11-01 | Disposition: A | Discharge status: Home / Self Care | Payer: Managed Care, Other (non HMO) | Source: Ambulatory Visit | Attending: Internal Medicine | Admitting: Internal Medicine

## 2015-11-01 DIAGNOSIS — R928 Other abnormal and inconclusive findings on diagnostic imaging of breast: Secondary | ICD-10-CM

## 2015-11-07 ENCOUNTER — Ambulatory Visit: Payer: Self-pay | Admitting: Surgery

## 2015-11-08 ENCOUNTER — Encounter: Payer: Self-pay | Admitting: Hematology & Oncology

## 2015-11-09 ENCOUNTER — Other Ambulatory Visit: Payer: Self-pay | Admitting: Surgery

## 2015-11-09 DIAGNOSIS — C50911 Malignant neoplasm of unspecified site of right female breast: Secondary | ICD-10-CM

## 2015-11-20 ENCOUNTER — Ambulatory Visit
Admission: RE | Admit: 2015-11-20 | Discharge: 2015-11-20 | Disposition: A | Discharge status: Home / Self Care | Payer: Managed Care, Other (non HMO) | Source: Ambulatory Visit | Attending: Surgery | Admitting: Surgery

## 2015-11-20 DIAGNOSIS — C50911 Malignant neoplasm of unspecified site of right female breast: Secondary | ICD-10-CM

## 2015-11-20 MED ORDER — GADOBENATE DIMEGLUMINE 529 MG/ML IV SOLN
20.0000 mL | Freq: Once | INTRAVENOUS | Status: AC | PRN
  Administered 2015-11-20: 20 mL via INTRAVENOUS

## 2015-11-21 ENCOUNTER — Encounter: Payer: Self-pay | Admitting: Hematology & Oncology

## 2015-11-21 ENCOUNTER — Ambulatory Visit: Payer: Managed Care, Other (non HMO)

## 2015-11-21 ENCOUNTER — Ambulatory Visit (HOSPITAL_BASED_OUTPATIENT_CLINIC_OR_DEPARTMENT_OTHER): Payer: Managed Care, Other (non HMO) | Admitting: Hematology & Oncology

## 2015-11-21 ENCOUNTER — Other Ambulatory Visit (HOSPITAL_BASED_OUTPATIENT_CLINIC_OR_DEPARTMENT_OTHER): Payer: Managed Care, Other (non HMO)

## 2015-11-21 VITALS — BP 153/83 | HR 66 | Temp 98.5°F | Resp 16 | Ht 64.0 in | Wt 275.0 lb

## 2015-11-21 DIAGNOSIS — Z853 Personal history of malignant neoplasm of breast: Secondary | ICD-10-CM

## 2015-11-21 DIAGNOSIS — C50811 Malignant neoplasm of overlapping sites of right female breast: Secondary | ICD-10-CM | POA: Diagnosis not present

## 2015-11-21 DIAGNOSIS — C50919 Malignant neoplasm of unspecified site of unspecified female breast: Secondary | ICD-10-CM

## 2015-11-21 DIAGNOSIS — Z170 Estrogen receptor positive status [ER+]: Secondary | ICD-10-CM

## 2015-11-21 LAB — COMPREHENSIVE METABOLIC PANEL (CC13)
ALK PHOS: 173 IU/L — AB (ref 39–117)
ALT: 8 IU/L (ref 0–32)
AST: 12 IU/L (ref 0–40)
Albumin, Serum: 4.1 g/dL (ref 3.5–5.5)
Albumin/Globulin Ratio: 1.1 — ABNORMAL LOW (ref 1.2–2.2)
BILIRUBIN TOTAL: 0.2 mg/dL (ref 0.0–1.2)
BUN/Creatinine Ratio: 16 (ref 9–23)
BUN: 17 mg/dL (ref 6–24)
CHLORIDE: 100 mmol/L (ref 96–106)
CO2: 30 mmol/L — AB (ref 18–29)
CREATININE: 1.04 mg/dL — AB (ref 0.57–1.00)
Calcium, Ser: 9.7 mg/dL (ref 8.7–10.2)
GFR calc Af Amer: 70 mL/min/{1.73_m2} (ref >59)
GFR calc non Af Amer: 61 mL/min/{1.73_m2} (ref >59)
GLUCOSE: 97 mg/dL (ref 65–99)
Globulin, Total: 3.6 g/dL (ref 1.5–4.5)
Potassium, Ser: 4 mmol/L (ref 3.5–5.2)
SODIUM: 140 mmol/L (ref 134–144)
Total Protein: 7.7 g/dL (ref 6.0–8.5)

## 2015-11-21 LAB — CBC WITH DIFFERENTIAL (CANCER CENTER ONLY)
BASO#: 0 10*3/uL (ref 0.0–0.2)
BASO%: 0.1 % (ref 0.0–2.0)
EOS%: 1.5 % (ref 0.0–7.0)
Eosinophils Absolute: 0.1 10*3/uL (ref 0.0–0.5)
HCT: 40.1 % (ref 34.8–46.6)
HGB: 13.2 g/dL (ref 11.6–15.9)
LYMPH#: 3 10*3/uL (ref 0.9–3.3)
LYMPH%: 35 % (ref 14.0–48.0)
MCH: 29.3 pg (ref 26.0–34.0)
MCHC: 32.9 g/dL (ref 32.0–36.0)
MCV: 89 fL (ref 81–101)
MONO#: 0.7 10*3/uL (ref 0.1–0.9)
MONO%: 7.9 % (ref 0.0–13.0)
NEUT#: 4.7 10*3/uL (ref 1.5–6.5)
NEUT%: 55.5 % (ref 39.6–80.0)
Platelets: 266 10*3/uL (ref 145–400)
RBC: 4.51 10*6/uL (ref 3.70–5.32)
RDW: 13.4 % (ref 11.1–15.7)
WBC: 8.5 10*3/uL (ref 3.9–10.0)

## 2015-11-21 NOTE — Progress Notes (Signed)
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 Other    New Patient  : I have another breast cancer  HPI: Ms. Stigall is a very nice 55 year old postmenopausal African-American female.I have known her for close to 20 years. She is a remote history of stage II ductal carcinoma of the left breast. She was treated with radiation and chemotherapy. That tumor was ER negative.  She had DCIS of the left breast in 2011. She underwent mastectomy.  Now, she had a routine mammogram of the right breast. This showed an area of abnormality. She underwent a biopsy. The biopsy report([id number]) showed an invasive ductal carcinoma. This tumor was ER positive/PR positive/HER-2 negative.  She had a breast MRI done yesterday. This did show a mass in the right breast. It measured 1.2 x 0.9 x 0.8 cm.  It was at the 9:30 position. There was a smaller nodule measuring 6 x 4 x 5 mm. A third 4 mm nodule was also noted. These were all in the  vicinity of the primary cancer. There is no abnormal lymph nodes. A non-enhancing mass is located in the inner lower quadrant of the right breast. This is suspicious for DCIS. It measures 4 x 1.5 x 1 cm.  She is seen the surgeon tomorrow.  She had her last monthly cycle at age 89. She has had some postmenopausal symptoms but not too bad. She is not on postmenopausal she lesions.   She has never been pregnant.  She does not smoke or drink.  She currently is working at home.  There is a strong family history of breast cancer. I think 2 or 3 sisters have breast cancer. Her mother had breast cancer. She has been checked in the past for BRCA and this was negative. We may have to do a more sensitive genetic test on her.  She really would like to avoid having to do a mastectomy.  Overall, her performance status  is ECOG 0.    Past Medical History  Diagnosis Date  . Cancer ([place]) 1999, 2011    BREAST CA X 2 (LEFT BR BOTH TIMES)  . Hypertension   :  Past Surgical History  Procedure Laterality Date  . Cervical cerclage    . Mastectomy  05/2010    LEFT BREAST  . Breast surgery  2000    LUMPECTOMY, RAD & CHEMO--MASTECTOMY IN 2011  :   Current outpatient prescriptions:  .  amLODipine (NORVASC) 10 MG tablet, Take 10 mg by mouth daily.  , Disp: , Rfl:  .  Cholecalciferol (VITAMIN D) 2000 UNITS tablet, Take 2,000 Units by mouth daily., Disp: , Rfl:  .  EPINEPHrine (EPIPEN IJ), Inject 1 application as directed once. , Disp: , Rfl:  .  triamterene-hydrochlorothiazide (MAXZIDE-25) 37.5-25 MG per tablet, Take 1 tablet by mouth daily.  , Disp: , Rfl: :  :  Allergies  Allergen Reactions  . Sesame Oil Anaphylaxis  :  Family History  Problem Relation Age of Onset  . Diabetes Mother   . Hypertension Mother   . Hypertension Father   . Diabetes Sister   . Cancer Sister     thyroid cancer  . Diabetes Sister   . Diabetes Sister   :  Social History   Social History  . Marital Status: Married  Spouse Name: N/A  . Number of Children: N/A  . Years of Education: N/A   Occupational History  . Not on file.   Social History Main Topics  . Smoking status: Never Smoker   . Smokeless tobacco: Never Used  . Alcohol Use: No  . Drug Use: No  . Sexual Activity: Yes    Birth Control/ Protection: Post-menopausal   Other Topics Concern  . Not on file   Social History Narrative  :  Pertinent items are noted in HPI.  Exam: _0 @ Obese African American female in no obvious distress. Vital signs show temperature of 98. Pulse 66. Blood pressure 153/83. Weight is 275 pounds. Neck exam shows no ocular or oral lesions. She has no palpable cervical or supraclavicular lymph nodes. Lungs are clear. Cardiac exam regular rate and rhythm with a normal S1 and S2. There are no murmurs, rubs or  bruits. Breast exam shows left chest wall with a well-healed mastectomy. The left chest wall nodules are noted. There is no left axillary adenopathy. Right breast shows a nodule at about the 10:00 position. This is where she had her biopsy. I cannot palpate any other antibodies in the right breast. There is no right axillary adenopathy. Abdomen is soft. She is obese. She has good bowel sounds. There is no fluid wave. There is no palpable liver or spleen tip. Back exam shows no tenderness over the spine, ribs or hips. Extremities shows no clubbing, cyanosis or edema. Neurological exam shows no focal neurological deficits. Skin exam shows no rashes, ecchymoses or petechia.   Recent Labs  11/21/15 1449  WBC 8.5  HGB 13.2  HCT 40.1  PLT 266   No results for input(s): NA, K, CL, CO2, GLUCOSE, BUN, CREATININE, CALCIUM in the last 72 hours.  Blood smear review:  none  Pathology: see above    Assessment and Plan:  Ms. Gotwalt is a very charming 55 year old Afro-American female. She is post menopausal. She had a remote history of stage II carcinoma of the left breast back in 1998 area and she is treated with chemotherapy and radiation therapy. She was estrogen negative.  She now has a new cancer of the right breast. This is estrogen positive and HER-2 negative.  I think the real question is whether or not she has ductal carcinoma in situ in the inner aspect of the right breast. I think if she does, she will definitely need to have a mastectomy. I cannot imagine a lumpectomy being done or multiple lumpectomies being done. She does have fairly large breast tissue so I suppose that multiple lumpectomies could be accomplished. She would then need radiation therapy.  Whether or not she needs chemotherapy will be based upon the Oncotype assay that needs to be done. Of course, with idiotic insurance rules, we cannot do this until after she has definitive breast surgery. I would have to think that she will  have negative lymph nodes.  Hopefully, the Oncotype assay will be low so she would not need any chemotherapy.  I talked her for about an hour. It was good to see her again. She looks in great shape. She really does not looking older than when I last saw her.  I probably will plan to get her back in about 6 weeks. I will like to think that by then, she will have had definitive surgery. We can then do the Oncotype assay.  I also want to make sure that we get another genetic assay for her.  I think with a new genetic sequencing assays that are out there, we might be able to identify another gene that she may have that is increasing her risk of breast cancer.  Lum Keas

## 2015-11-22 LAB — LACTATE DEHYDROGENASE: LDH: 164 U/L (ref 125–245)

## 2015-11-23 ENCOUNTER — Other Ambulatory Visit: Payer: Self-pay | Admitting: *Deleted

## 2015-11-23 ENCOUNTER — Encounter: Payer: Self-pay | Admitting: Genetic Counselor

## 2015-11-23 ENCOUNTER — Other Ambulatory Visit: Payer: Self-pay | Admitting: Surgery

## 2015-11-23 DIAGNOSIS — R921 Mammographic calcification found on diagnostic imaging of breast: Secondary | ICD-10-CM

## 2015-11-23 DIAGNOSIS — C50919 Malignant neoplasm of unspecified site of unspecified female breast: Secondary | ICD-10-CM

## 2015-11-29 ENCOUNTER — Ambulatory Visit
Admission: RE | Admit: 2015-11-29 | Discharge: 2015-11-29 | Disposition: A | Discharge status: Home / Self Care | Payer: Managed Care, Other (non HMO) | Source: Ambulatory Visit | Attending: Surgery | Admitting: Surgery

## 2015-11-29 DIAGNOSIS — R921 Mammographic calcification found on diagnostic imaging of breast: Secondary | ICD-10-CM

## 2015-11-30 NOTE — Progress Notes (Addendum)
Location of Breast Cancer: New Invasive ductal carcinoma of the RIGHT breast    Histology per Pathology Report:   11/01/15 Diagnosis Breast, right, needle core biopsy, 9:00 o'clock - INVASIVE MAMMARY CARCINOMA. - SEE COMMENT.  Receptor Status: ER(90%), PR (80%), Her2-neu (negative), Ki-(10%)  Did patient present with symptoms (if so, please note symptoms) or was this found on screening mammography?: screening mammogram  Past/Anticipated interventions by surgeon, if any: Dr. Brantley Stage - no surgery planned yet.  Past/Anticipated interventions by medical oncology, if any:  Patient had chemotherapy in 1999 for cancer in her left breast.  She will have oncotype after surgery per Dr. Marin Olp.  Lymphedema issues, if any:  no  Pain issues, if any:  no   SAFETY ISSUES:  Prior radiation? Yes - left breast in 2000 by Dr. Valere Dross  Pacemaker/ICD? no  Possible current pregnancy?no  Is the patient on methotrexate? no  Current Complaints / other details:  Patient reports she had breast cancer in her left breast twice - 2000 and 2011.  She has 3 sisters with breast cancer.  She reports that she may need another biopsy close to the first site.  BP 143/85 mmHg  Pulse 70  Temp(Src) 98.1 F (36.7 C) (Oral)  Ht '5\' 4"'$  (1.626 m)  Wt 279 lb 4.8 oz (126.69 kg)  BMI 47.92 kg/m2  SpO2 100%    Jacqulyn Liner, RN 11/30/2015,8:38 AM

## 2015-12-05 ENCOUNTER — Encounter: Payer: Self-pay | Admitting: Radiation Oncology

## 2015-12-05 ENCOUNTER — Ambulatory Visit
Admission: RE | Admit: 2015-12-05 | Discharge: 2015-12-05 | Disposition: A | Discharge status: Home / Self Care | Payer: Managed Care, Other (non HMO) | Source: Ambulatory Visit | Attending: Radiation Oncology | Admitting: Radiation Oncology

## 2015-12-05 VITALS — BP 143/85 | HR 70 | Temp 98.1°F | Ht 64.0 in | Wt 279.3 lb

## 2015-12-05 DIAGNOSIS — C50911 Malignant neoplasm of unspecified site of right female breast: Secondary | ICD-10-CM | POA: Diagnosis not present

## 2015-12-05 DIAGNOSIS — C50411 Malignant neoplasm of upper-outer quadrant of right female breast: Secondary | ICD-10-CM

## 2015-12-05 NOTE — Progress Notes (Signed)
Radiation Oncology         [phone]) [phone] ________________________________  Initial Outpatient Consultation  Name: Diane Barnes MRN: [id number]  Date: 12/05/2015  DOB: [birth year]  AC:ZYSA,YTKZ P, MD  Marin Olp Rudell Cobb, MD   REFERRING PHYSICIAN: Volanda Napoleon, MD  DIAGNOSIS: Multicentric right breast cancer  HISTORY OF PRESENT ILLNESS::Diane Barnes is a 55 y.o. 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 This was treated with radiation (Dr. Valere Dross) and chemotherapy (Dr. Marin Olp). She was followed with mammogram surveillance and she was diagnosed with DCIS of the left breast in 2011 and she underwent a mastectomy.  Screening mammogram on 10/01/15 noted a possible asymmetry in the right breast. Mammogram on 10/15/15 noted 2 possible asymmetries in the right breast, but ultrasound noted a single 6.2 x 5.5 x 3.5 mm mass in the 9 o'clock position. The other asymmetry might be a benign right breast calcification in the LIQ.  Biopsy on 11/01/15 revealed grade 1-2 invasive mammary carcinoma (ER 90% positive, PR 80% positive, HER2 negative, Ki67 10% positive) of the right breast.  MRI of the bilateral breasts on 11/20/15 noted multiple masse, satellite nodules, and calcifications in the right breast.  Biopsy of LIQ calcifications in the right breast on 11/29/15 revealed DCIS (ER 95% positive, PR 5% positive)  PREVIOUS RADIATION THERAPY: Yes.  11/19/1998 - 01/15/1999: 45 Gy in 25 fractions to the left breast and regional lymph nodes. 18 Gy in 9 fractions as a boost to the left breast tumor bed.  6 MV photons, left axilla/supraclavicular region, 16 MB/18MV photons, left breast tangents. 20 MEV electrons, left breast tumor bed boost. (Dr. Valere Dross)  Diane Barnes:  has a past medical history of Cancer (Diane Barnes) (1999, 2011) and Hypertension.    PAST SURGICAL HISTORY: Past Surgical History  Procedure Laterality Date  . Cervical cerclage     . Mastectomy  05/2010    LEFT BREAST  . Breast surgery  2000    LUMPECTOMY, RAD & CHEMO--MASTECTOMY IN 2011    FAMILY HISTORY: family history includes Breast cancer in her sister; Cancer in her sister; Diabetes in her mother, sister, sister, and sister; Hypertension in her father and mother.  SOCIAL HISTORY:  reports that she has never smoked. She has never used smokeless tobacco. She reports that she does not drink alcohol or use illicit drugs.  ALLERGIES: Sesame oil   MEDICATIONS:  Current Outpatient Prescriptions  Medication Sig Dispense Refill  . amLODipine (NORVASC) 10 MG tablet Take 10 mg by mouth daily.      . Cholecalciferol (VITAMIN D) 2000 UNITS tablet Take 2,000 Units by mouth daily.    Marland Kitchen EPINEPHrine (EPIPEN IJ) Inject 1 application as directed once.     . meloxicam (MOBIC) 15 MG tablet Take 15 mg by mouth daily.     No current facility-administered medications for this encounter.    REVIEW OF SYSTEMS:  A 15 point review of systems is documented in the electronic medical record. This was obtained by the nursing staff. However, I reviewed this with the patient to discuss relevant findings and make appropriate changes.  No breast pain nipple discharge or bleeding prior to most recent biopsies.   PHYSICAL EXAM:  height is '5\' 4"'$  (1.626 m) and weight is 279 lb 4.8 oz (126.69 kg). Her oral temperature is 98.1 F (36.7 C). Her blood pressure is 143/85 and her pulse is 70. Her oxygen saturation is 100%.  General: Alert and oriented, in no acute distress HEENT: Head is normocephalic. Extraocular movements are intact. Oropharynx is clear. Neck: Neck is supple, no palpable cervical or supraclavicular lymphadenopathy. Heart: Regular in rate and rhythm with no murmurs, rubs, or gallops. Chest: Clear to auscultation bilaterally, with no rhonchi, wheezes, or rales. Left chest wall reveals a mastectomy scar without palpable or visible signs of recurrence. Examination of the right  breast reveals it to be large and pendulous. She has a scar in the 12 o'clock position from a prior benign remote biopsy. Small biopsy in the 9 o'clock position of the right breast. some thickening in the LIQ of the right breast consistent with a recent biopsy. No palpated dominant mass in the breast. No nipple discharge or bleeding. Abdomen: Soft, nontender, nondistended, with no rigidity or guarding. Extremities: No cyanosis or edema. Lymphatics: see Neck Exam Skin: No concerning lesions. Musculoskeletal: symmetric strength and muscle tone throughout. Neurologic: Cranial nerves II through XII are grossly intact. No obvious focalities. Speech is fluent. Coordination is intact. Psychiatric: Judgment and insight are intact. Affect is appropriate.  ECOG = 1   LABORATORY DATA:  Lab Results  Component Value Date   WBC 8.5 11/21/2015   HGB 13.2 11/21/2015   HCT 40.1 11/21/2015   MCV 89 11/21/2015   PLT 266 11/21/2015   NEUTROABS 4.7 11/21/2015   Lab Results  Component Value Date   NA 140 11/21/2015   K 4.0 11/21/2015   CL 100 11/21/2015   CO2 30* 11/21/2015   GLUCOSE 97 11/21/2015   CREATININE 1.04* 11/21/2015   CALCIUM 9.7 11/21/2015      RADIOGRAPHY: Mr Breast Bilateral W Wo Contrast  11/20/2015  CLINICAL DATA:  The patient has a history of left breast malignancy in 2011 status post mastectomy. Recent right breast screening mammography demonstrated a small mass located within the lateral right breast. Ultrasound-guided core biopsy demonstrated a grade 1-2 invasive mammary carcinoma in this region. In addition, a adjacent smaller nodule was seen in this region and suspicious calcifications were also noted within the lower inner quadrant of the right breast. The patient is referred for pre treatment breast MRI. LABS:  Renal profile was performed at [facility]. Creatinine = 0.8. GFR = 90. EXAM: BILATERAL BREAST MRI WITH AND WITHOUT CONTRAST TECHNIQUE:  Multiplanar, multisequence MR images of both breasts were obtained prior to and following the intravenous administration of 20 ml of MultiHance. THREE-DIMENSIONAL MR IMAGE RENDERING ON INDEPENDENT WORKSTATION: Three-dimensional MR images were rendered by post-processing of the original MR data on an independent workstation. The three-dimensional MR images were interpreted, and findings are reported in the following complete MRI report for this study. Three dimensional images were evaluated at the independent DynaCad workstation COMPARISON:  Mammograms dated 11/01/2015, 10/15/2015, 10/01/2015, 06/21/2013. FINDINGS: Breast composition: b. Scattered fibroglandular tissue. Background parenchymal enhancement: Moderate. Right breast: There is an irregular enhancing mass located within the right breast at the 9:30 o'clock position middle depth with washout enhancement kinetics and adjacent signal void corresponding to the recently diagnosed invasive mammary carcinoma. This measures 1.2 x 0.9 x 0.8 cm in size. Located 1.8 cm superior and slightly medial to this is a smaller 6 x 4 by 5 mm irregular enhancing mass worrisome for a small satellite malignancy. Also, a third 4 mm enhancing mass is located 3 mm anterior and superior to this smaller mass (2.2 cm from the known cancer). There is also an enhancing mass with a mixture of plateau and washout  kinetics measuring 8 x 7 x 7 mm in size located within the right breast anterior 1/3 at the 9 o'clock position which is an indeterminate mass. Finally, there is an area of non mass enhancement measuring 4.0 x 1.5 x 1.0 cm in size located within the lower inner quadrant of the right breast in the area of suspicious calcifications noted on the recent mammogram. 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 Left breast: There has been a previous left mastectomy. Lymph nodes: No abnormal appearing lymph nodes. Ancillary findings:  None. IMPRESSION: 1. 1.2 cm irregular enhancing mass within the  right breast at the 9:30 o'clock position corresponding to the recently diagnosed invasive mammary carcinoma. 2. Two smaller satellite nodules located anterior and slightly medial to the known malignancy worrisome for satellite malignancies. These measure 6 and 4 mm in size and are approximately 2 cm from the larger mass. 3. 8 mm enhancing indeterminate mass located within the anterior 1/3 of the right breast 9 o'clock position. 4. 4.0 x 1.5 x 1.0 cm area of non mass enhancement located within the lower inner quadrant of the right breast in the area of the suspicious calcifications noted on mammography. RECOMMENDATION: If breast conservation is planned, I recommend stereotactic biopsy of the calcifications within the lower inner quadrant of the right breast which corresponds to the area of non mass enhancement noted on the recent breast MRI. BI-RADS CATEGORY  4: Suspicious. Electronically Signed   By: Altamese Cabal M.D.   On: 11/20/2015 11:36   Mm Digital Diagnostic Unilat R  11/29/2015  CLINICAL DATA:  Status post stereotactic core needle biopsy for right breast lower inner quadrant calcifications. EXAM: DIAGNOSTIC RIGHT MAMMOGRAM POST STEREOTACTIC BIOPSY COMPARISON:  Previous exam(s). FINDINGS: Mammographic images were obtained following stereotactic guided biopsy of right breast lower inner quadrant calcifications. Two-view mammography demonstrates presence of coil shaped marker within the lower inner quadrant of the right breast, at the site of biopsy corresponding to the group of indeterminate calcifications. IMPRESSION: Successful placement of tissue marker post stereotactic core needle biopsy of the right breast. Final Assessment: Post Procedure Mammograms for Marker Placement Electronically Signed   By: Fidela Salisbury M.D.   On: 11/29/2015 10:08   Mm Rt Breast Bx W Loc Dev 1st Lesion Image Bx Spec Stereo Guide  11/30/2015  ADDENDUM REPORT: 11/30/2015 10:45 ADDENDUM: Pathology revealed HIGH  GRADE DUCTAL CARCINOMA IN SITU of the lower inner quadrant of the Right breast. This was found to be concordant by Dr. Fidela Salisbury. Pathology results were discussed with the patient by telephone. The patient reported doing well after the biopsy with tenderness at the site. Post biopsy instructions and care were reviewed and questions were answered. The patient was encouraged to call [facility] for any additional concerns. The patient has a recent diagnosis of right breast cancer and should follow her outlined treatment plan. Pathology results were called to Dr. Marcello Moores Cornett's office and discussed with Carlene Coria. Pathology results reported by Terie Purser, RN on 11/30/2015. Electronically Signed   By: Fidela Salisbury M.D.   On: 11/30/2015 10:45  11/30/2015  CLINICAL DATA:  Right breast lower inner quadrant calcifications, suspicious for DCIS in a patient with proven right breast cancer at 9 o'clock. EXAM: RIGHT BREAST STEREOTACTIC CORE NEEDLE BIOPSY COMPARISON:  Previous exams. FINDINGS: The patient and I discussed the procedure of stereotactic-guided biopsy including benefits and alternatives. We discussed the high likelihood of a successful procedure. We discussed the risks of the procedure  including infection, bleeding, tissue injury, clip migration, and inadequate sampling. Informed written consent was given. The usual time out protocol was performed immediately prior to the procedure. Using sterile technique and 1% Lidocaine as local anesthetic, under stereotactic guidance, a 9 gauge vacuum assisted device was used to perform core needle biopsy of calcifications in the lower inner quadrant of the right breast using a medial approach. Specimen radiograph was performed showing presence of calcifications. Specimens with calcifications are identified for pathology. At the conclusion of the procedure, a coil shaped tissue marker clip was deployed into the biopsy cavity.  Follow-up 2-view mammogram was performed and dictated separately. IMPRESSION: Stereotactic-guided biopsy of right breast lower inner quadrant calcifications. No apparent complications. Please note that if the pathology results from this biopsy are benign, the patient will need another stereotactic biopsy for a suspicious nodule seen by MRI at approximately 9 o'clock, posterior depth. Electronically Signed: By: Fidela Salisbury M.D. On: 11/29/2015 10:07      IMPRESSION: Multicentric right breast cancer  I discussed with the patient she has biopsy proven right breast cancer and in opposite quadrants of the breast. Therefore, she would not be a good candidate for breast conservation therapy. This would be a contraindication for breast radiation therapy. In light of the patient's prior history of two breast cancers on the left side and now two in the right, I feel she will be at further risk for additional breast cancers of the right breast without a mastectomy.  PLAN: The patient will schedule an appointment with Dr. Brantley Stage regarding a mastectomy and sentinel lymph node biopsy procedure. She will meet with Dr. Marin Olp is a post-operative setting to discuss systemic therapy. It is doubtful the patient will require post-mastectomy radiation at this time.  ------------------------------------------------  Blair Promise, PhD, MD  This document serves as a record of services personally performed by Gery Pray, MD. It was created on his behalf by Darcus Austin, a trained medical scribe. The creation of this record is based on the scribe's personal observations and the provider's statements to them. This document has been checked and approved by the attending provider.

## 2015-12-05 NOTE — Progress Notes (Signed)
Please see the Nurse Progress Note in the MD Initial Consult Encounter for this patient. 

## 2015-12-05 NOTE — Addendum Note (Signed)
Encounter addended by: Jacqulyn Liner, RN on: 12/05/2015  3:36 PM<BR>     Documentation filed: Charges VN

## 2015-12-10 ENCOUNTER — Ambulatory Visit: Payer: Self-pay | Admitting: Surgery

## 2015-12-10 DIAGNOSIS — C50911 Malignant neoplasm of unspecified site of right female breast: Secondary | ICD-10-CM

## 2015-12-10 NOTE — H&P (Signed)
Diane Barnes 12/10/2015 2:39 PM Location: [facility] Patient #: [id number] DOB: 28-Nov-1960 Married / Language: English / Race: Black or African American Female  History of Present Illness Diane Moores A. Diane Benway MD; 12/10/2015 4:15 PM) Patient words: Patient returns for follow-up after diagnosis of right breast cancer. MRI was done which shows multifocal disease. 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 She is recovered from this. She is here to discuss surgical options.  The patient is a 55 year old female.   Allergies Diane Barnes, [place]; 12/10/2015 2:39 PM) Diane Barnes (Diagnostic) *DIAGNOSTIC PRODUCTS* Sesame Oil *CHEMICALS*  Medication History Diane Barnes, [place]; 12/10/2015 2:39 PM) AmLODIPine Besylate (5MG  Tablet, Oral) Active. EPINEPHrine (0.15MG /0.15ML Soln Auto-inj, Injection) Active. Meloxicam (15MG  Tablet, Oral) Active. Medications Reconciled    Vitals Diane Barnes CMA; 12/10/2015 2:40 PM) 12/10/2015 2:39 PM Weight: 280 lb Height: 64in Body Surface Area: 2.26 m Body Mass Index: 48.06 kg/m  Temp.: 98.5F  Pulse: 74 (Regular)  BP: 140/82 (Sitting, Left Arm, Standard)      Physical Exam (Diane Shiel A. Fiora Weill MD; 12/10/2015 4:16 PM)  General Mental Status-Alert. General Appearance-Consistent with stated age. Hydration-Well hydrated. Voice-Normal.  Breast Note: Left breast surgically absent. Right breast shows multiple bruising from multiple biopsy sites.    Assessment & Plan (Diane Boutin A. Diane Riebe MD; 12/10/2015 4:17 PM)  BREAST CANCER, RIGHT (C50.911) Impression: Given the amount of disease and right breasts are recommend a right simple mastectomy with right axillary sentinel lymph node mapping. She has no interest reconstruction at this point. Discussed treatment options for breast cancer to include breast conservation vs mastectomy with reconstruction. Pt has decided on mastectomy. Risk include bleeding, infection, flap necrosis,  pain, numbness, recurrence, hematoma, other surgery needs. Pt understands and agrees to proceed. Risk of sentinel lymph node mapping include bleeding, infection, lymphedema, shoulder pain. stiffness, dye allergy. cosmetic deformity , blood clots, death, need for more surgery. Pt agres to proceed.  Current Plans You are being scheduled for surgery - Our schedulers will call you.  You should hear from our office's scheduling department within 5 working days about the location, date, and time of surgery. We try to make accommodations for patient's preferences in scheduling surgery, but sometimes the OR schedule or the surgeon's schedule prevents [place] from making those accommodations.  If you have not heard from our office [phone]) in 5 working days, call the office and ask for your surgeon's nurse.  If you have other questions about your diagnosis, plan, or surgery, call the office and ask for your surgeon's nurse.  We discussed the staging and pathophysiology of breast cancer. We discussed all of the different options for treatment for breast cancer including surgery, chemotherapy, radiation therapy, Herceptin, and antiestrogen therapy. We discussed a sentinel lymph node biopsy as she does not appear to having lymph node involvement right now. We discussed the performance of that with injection of radioactive tracer and blue dye. We discussed that she would have an incision underneath her axillary hairline. We discussed that there is a bout a 10-20% chance of having a positive node with a sentinel lymph node biopsy and we will await the permanent pathology to make any other first further decisions in terms of her treatment. One of these options might be to return to the operating room to perform an axillary lymph node dissection. We discussed about a 1-2% risk lifetime of chronic shoulder pain as well as lymphedema associated with a sentinel lymph node biopsy. We discussed the options  for treatment of  the breast cancer which included lumpectomy versus a mastectomy. We discussed the performance of the lumpectomy with a wire placement. We discussed a 10-20% chance of a positive margin requiring reexcision in the operating room. We also discussed that she may need radiation therapy or antiestrogen therapy or both if she undergoes lumpectomy. We discussed the mastectomy and the postoperative care for that as well. We discussed that there is no difference in her survival whether she undergoes lumpectomy with radiation therapy or antiestrogen therapy versus a mastectomy. There is a slight difference in the local recurrence rate being 3-5% with lumpectomy and about 1% with a mastectomy. We discussed the risks of operation including bleeding, infection, possible reoperation. She understands her further therapy will be based on what her stages at the time of her operation.  Pt Education - [facility] breast cancer surgery: discussed with patient and provided information. Pt Education - [facility] Mastectomy HCI HISTORY OF BREAST CANCER (Z85.3)

## 2016-01-02 ENCOUNTER — Encounter: Payer: Self-pay | Admitting: Genetic Counselor

## 2016-01-03 ENCOUNTER — Ambulatory Visit (HOSPITAL_BASED_OUTPATIENT_CLINIC_OR_DEPARTMENT_OTHER): Payer: Managed Care, Other (non HMO) | Admitting: Genetic Counselor

## 2016-01-03 ENCOUNTER — Other Ambulatory Visit: Payer: Managed Care, Other (non HMO)

## 2016-01-03 ENCOUNTER — Encounter: Payer: Self-pay | Admitting: Genetic Counselor

## 2016-01-03 DIAGNOSIS — Z853 Personal history of malignant neoplasm of breast: Secondary | ICD-10-CM | POA: Diagnosis not present

## 2016-01-03 DIAGNOSIS — Z806 Family history of leukemia: Secondary | ICD-10-CM

## 2016-01-03 DIAGNOSIS — Z803 Family history of malignant neoplasm of breast: Secondary | ICD-10-CM | POA: Diagnosis not present

## 2016-01-03 DIAGNOSIS — Z808 Family history of malignant neoplasm of other organs or systems: Secondary | ICD-10-CM

## 2016-01-03 DIAGNOSIS — Z809 Family history of malignant neoplasm, unspecified: Secondary | ICD-10-CM

## 2016-01-03 DIAGNOSIS — Z315 Encounter for genetic counseling: Secondary | ICD-10-CM

## 2016-01-03 DIAGNOSIS — Z800 Family history of malignant neoplasm of digestive organs: Secondary | ICD-10-CM | POA: Diagnosis not present

## 2016-01-03 DIAGNOSIS — Z8042 Family history of malignant neoplasm of prostate: Secondary | ICD-10-CM

## 2016-01-03 DIAGNOSIS — C50411 Malignant neoplasm of upper-outer quadrant of right female breast: Secondary | ICD-10-CM

## 2016-01-03 NOTE — Progress Notes (Signed)
REFERRING PROVIDER: Burney Gauze, MD  OTHER PROVIDER(S): Erroll Luna, MD Gery Pray, MD  PRIMARY PROVIDER:  Kennon Portela, MD  PRIMARY REASON FOR VISIT:  1. Breast cancer of upper-outer quadrant of right female breast ([place])   2. History of malignant neoplasm of left breast   3. Family history of breast cancer in sister   34. Family history of pancreatic cancer   5. Family history of prostate cancer   6. Family history of thyroid cancer   7. Family history of leukemia   8. Family history of cancer      HISTORY OF PRESENT ILLNESS:   Diane Barnes, a 55 y.o. female, was seen for a [place] cancer genetics consultation at the request of Dr. Marin Olp due to a personal history of bilateral and multiple primary breast cancers and family history of breast, prostate, pancreatic, and other cancers.  Ms. Lentsch presents to clinic today to discuss the possibility of a hereditary predisposition to cancer, genetic testing, and to further clarify her future cancer risks, as well as potential cancer risks for family members.   In 1999, at the age of 64, Ms. 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  This breast cancer was reportedly ER- and was treated with left lumpectomy, radiation, and chemotherapy.  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  Hormone receptor status was ER/PR+, and this was treated with left mastectomy.   More recently, Ms. 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  Hormone receptors were ER/PR+, Her2-.  This will be treated with right mastectomy and this surgery is scheduled for August 10th.  Genetic testing will further inform medical management decisions.  HORMONAL RISK FACTORS:  Menarche was at age 60.  First live birth at age - no children.  OCP use for approximately 0 years.  Ovaries intact: yes.  Hysterectomy: no.  Menopausal status: postmenopausal.  HRT use: 0  years. Colonoscopy: yes; "a few years ago"; reportedly no polyps; on a 10-year schedule. Mammogram within the last year: yes. Number of breast biopsies: multiple. Up to date with pelvic exams:  Has not had in awhile, but is schedule for one next week. Any excessive radiation exposure in the past:  no  Past Medical History:  Diagnosis Date  . Cancer ([place]) 1999, 2011   BREAST CA X 2 (LEFT BR BOTH TIMES)  . Hypertension     Past Surgical History:  Procedure Laterality Date  . BREAST SURGERY  2000   LUMPECTOMY, RAD & CHEMO--MASTECTOMY IN 2011  . CERVICAL CERCLAGE    . MASTECTOMY  05/2010   LEFT BREAST    Social History   Social History  . Marital status: Married    Spouse name: N/A  . Number of children: 0  . Years of education: N/A   Social History Main Topics  . Smoking status: Never Smoker  . Smokeless tobacco: Never Used  . Alcohol use 0.0 oz/week     Comment: occ - maybe 1 drink every 4 mos  . Drug use: No  . Sexual activity: Yes    Birth control/ protection: Post-menopausal   Other Topics Concern  . None   Social History Narrative  . None     FAMILY HISTORY:  We obtained a detailed, 4-generation family history.  Significant diagnoses are listed below: Family History  Problem Relation Age of Onset  . Diabetes Mother   . Hypertension Mother   .  Colon polyps Mother     unknown number  . Hypertension Father   . Congestive Heart Failure Father 32  . Diabetes Sister   . Thyroid cancer Sister 60    s/p thyroidectomy  . Other Sister     hysterectomy in late 87s for bleeding  . Diabetes Sister   . Breast cancer Sister 35  . Diabetes Sister   . Breast cancer Sister 4  . Breast cancer Sister 36  . Pancreatic cancer Maternal Uncle     d. late 31s  . Heart attack Maternal Grandmother 49  . Stroke Maternal Grandfather 71  . Prostate cancer Maternal Uncle     d. 84s  . Leukemia Paternal Uncle 58  . Cancer Paternal Grandmother     NOS type; d. 70  .  Tuberculosis Paternal Grandfather     d. 60  . Cancer Cousin     paternal 1st cousin, once-removed (PGF's sister's daughter) dx. NOS cancer (maybe colon ca) in her mid-80s    Ms. Holz is the second to the youngest of 7 daughters, ages 60-74.  Three other of her sisters were diagnosed with breast cancer at ages 26, 40, and 54.  Another sister was diagnosed with thyroid cancer (unspecified type) at the age of 86 and was treated with a thyroidectomy.  Ms. Mcatee reports no cancer history for any of her nieces or nephews.    Ms. Kluesner mother was never diagnosed with cancer and passed away due to "natural causes" at the age of 63.  Ms. Oleksy reports that her mother may have had some colon polyps, but she is unsure how many she had.  Her mother had 48 full brothers and one full sister.  Most of these siblings passed away at later ages and never had cancer.  One brother was diagnosed with and passed away due to pancreatic cancer in his late 49s.  One brother was diagnosed with prostate cancer and passed away in his 54s.  Ms. Gemmill reports no known cancer history for her maternal first cousins.  Her maternal grandmother died of a heart attack at 24.  Her grandfather died of a stroke at 66.  She has limited information for her maternal great aunts/uncles and great grandparents.  Ms. Belmonte father died of congestive heart failure at the age of 3.  He had five full sisters and one full brother.  Two sisters are currently in their 53s and have never had cancer.  Three sisters passed away at later ages, but not due to cancer.  His brother died of leukemia at age 72.  Ms. Golladay has some limited information for some of her paternal first cousins, but she is not aware of any cancer history.  Her paternal grandmother died of an unspecified type of cancer at the age of 82.  Her grandfather died of tuberculosis at the age of 76.  He had one sister who had a daughter who was diagnosed with cancer (maybe a colon cancer) in  her mid-30s.  Ms. Cannell had negative BRCA1/2 testing in 2011.  She is unaware of any additional family history of genetic testing for hereditary cancer.  Patient's maternal ancestors are of Native American/Cherokee and African American descent, and paternal ancestors are of [place] and [place] descent. There is no reported Ashkenazi Jewish ancestry. There is no known consanguinity.  GENETIC COUNSELING ASSESSMENT: Junior Kenedy is a 55 y.o. female with a personal and family history of breast cancer which is somewhat suggestive of  a hereditary breast cancer syndrome and predisposition to cancer. We, therefore, discussed and recommended the following at today's visit.   DISCUSSION: We reviewed the characteristics, features and inheritance patterns of hereditary cancer syndromes, particularly those caused by mutations within breast cancer-related genes more recently added to genetic testing options, such as PALB2, ATM, and CHEK2. We also discussed genetic testing, including the appropriate family members to test, the process of testing, insurance coverage and turn-around-time for results. We discussed the implications of a negative, positive and/or variant of uncertain significant result. We recommended Ms. Odenthal pursue genetic testing for the 20-gene Breast/Ovarian Cancer Panel through [facility].  The Breast/Ovarian Cancer Panel offered by [facility] Hope Pigeon, MD) includes sequencing and deletion/duplication analysis for the following 19 genes:  ATM, BARD1, BRCA1, BRCA2, BRIP1, CDH1, CHEK2, FANCC, MLH1, MSH2, MSH6, NBN, PALB2, PMS2, PTEN, RAD51C, RAD51D, TP53, and XRCC2.  This panel also includes deletion/duplication analysis (without sequencing) for one gene, EPCAM.  Based on Ms. Altemose's personal and family history of cancer, she meets medical criteria for genetic testing. Despite that she meets criteria, she may still have an out of pocket cost. We discussed that if her out of  pocket cost for testing is over $100, the laboratory will call and confirm whether she wants to proceed with testing.  If the out of pocket cost of testing is less than $100 she will be billed by the genetic testing laboratory.   PLAN: After considering the risks, benefits, and limitations, Ms. Josephson  provided informed consent to pursue genetic testing and the blood sample was sent to [facility] for analysis of the 20-gene Breast/Ovarian Cancer Panel. Results should be available within approximately 2-3 weeks' time, at which point they will be disclosed by telephone to Ms. Schoenfelder, as will any additional recommendations warranted by these results. Ms. Miklos will receive a summary of her genetic counseling visit and a copy of her results once available. This information will also be available in Epic. We encouraged Ms. Kren to remain in contact with cancer genetics annually so that we can continuously update the family history and inform her of any changes in cancer genetics and testing that may be of benefit for her family. Ms. Joffe questions were answered to her satisfaction today. Our contact information was provided should additional questions or concerns arise.  Thank you for the referral and allowing [place] to share in the care of your patient.   Jeanine Luz, MS, [facility] Certified Genetic Counselor [email or web address] Phone: [phone]  The patient was seen for a total of 60 minutes in face-to-face genetic counseling.  This patient was discussed with Drs. Magrinat, Lindi Adie and/or Burr Medico who agrees with the above.    _______________________________________________________________________ For Office Staff:  Number of people involved in session: 1 Was an Intern/ student involved with case: no

## 2016-01-08 ENCOUNTER — Ambulatory Visit: Payer: Managed Care, Other (non HMO) | Admitting: Hematology & Oncology

## 2016-01-08 ENCOUNTER — Other Ambulatory Visit: Payer: Managed Care, Other (non HMO)

## 2016-01-09 ENCOUNTER — Ambulatory Visit (INDEPENDENT_AMBULATORY_CARE_PROVIDER_SITE_OTHER): Payer: Managed Care, Other (non HMO) | Admitting: Gynecology

## 2016-01-09 ENCOUNTER — Encounter: Payer: Self-pay | Admitting: Gynecology

## 2016-01-09 ENCOUNTER — Other Ambulatory Visit (HOSPITAL_COMMUNITY): Payer: Self-pay | Admitting: *Deleted

## 2016-01-09 VITALS — BP 124/78 | Ht 64.0 in | Wt 278.0 lb

## 2016-01-09 DIAGNOSIS — Z853 Personal history of malignant neoplasm of breast: Secondary | ICD-10-CM

## 2016-01-09 DIAGNOSIS — Z01419 Encounter for gynecological examination (general) (routine) without abnormal findings: Secondary | ICD-10-CM | POA: Diagnosis not present

## 2016-01-09 NOTE — Addendum Note (Signed)
Addended by: Nelva Nay on: 01/09/2016 12:09 PM   Modules accepted: Orders

## 2016-01-09 NOTE — Patient Instructions (Signed)
Follow up for bone density after you fully recover from your surgery.  You may obtain a copy of any labs that were done today by logging onto MyChart as outlined in the instructions provided with your AVS (after visit summary). The office will not call with normal lab results but certainly if there are any significant abnormalities then we will contact you.   Health Maintenance Adopting a healthy lifestyle and getting preventive care can go a long way to promote health and wellness. Talk with your health care provider about what schedule of regular examinations is right for you. This is a good chance for you to check in with your provider about disease prevention and staying healthy. In between checkups, there are plenty of things you can do on your own. Experts have done a lot of research about which lifestyle changes and preventive measures are most likely to keep you healthy. Ask your health care provider for more information. WEIGHT AND DIET  Eat a healthy diet  Be sure to include plenty of vegetables, fruits, low-fat dairy products, and lean protein.  Do not eat a lot of foods high in solid fats, added sugars, or salt.  Get regular exercise. This is one of the most important things you can do for your health.  Most adults should exercise for at least 150 minutes each week. The exercise should increase your heart rate and make you sweat (moderate-intensity exercise).  Most adults should also do strengthening exercises at least twice a week. This is in addition to the moderate-intensity exercise.  Maintain a healthy weight  Body mass index (BMI) is a measurement that can be used to identify possible weight problems. It estimates body fat based on height and weight. Your health care provider can help determine your BMI and help you achieve or maintain a healthy weight.  For females 67 years of age and older:   A BMI below 18.5 is considered underweight.  A BMI of 18.5 to 24.9 is  normal.  A BMI of 25 to 29.9 is considered overweight.  A BMI of 30 and above is considered obese.  Watch levels of cholesterol and blood lipids  You should start having your blood tested for lipids and cholesterol at 56 years of age, then have this test every 5 years.  You may need to have your cholesterol levels checked more often if:  Your lipid or cholesterol levels are high.  You are older than 56 years of age.  You are at high risk for heart disease.  CANCER SCREENING   Lung Cancer  Lung cancer screening is recommended for adults 28-10 years old who are at high risk for lung cancer because of a history of smoking.  A yearly low-dose CT scan of the lungs is recommended for people who:  Currently smoke.  Have quit within the past 15 years.  Have at least a 30-pack-year history of smoking. A pack year is smoking an average of one pack of cigarettes a day for 1 year.  Yearly screening should continue until it has been 15 years since you quit.  Yearly screening should stop if you develop a health problem that would prevent you from having lung cancer treatment.  Breast Cancer  Practice breast self-awareness. This means understanding how your breasts normally appear and feel.  It also means doing regular breast self-exams. Let your health care provider know about any changes, no matter how small.  If you are in your 20s or 30s, you should  have a clinical breast exam (CBE) by a health care provider every 1-3 years as part of a regular health exam.  If you are 20 or older, have a CBE every year. Also consider having a breast X-ray (mammogram) every year.  If you have a family history of breast cancer, talk to your health care provider about genetic screening.  If you are at high risk for breast cancer, talk to your health care provider about having an MRI and a mammogram every year.  Breast cancer gene (BRCA) assessment is recommended for women who have family members  with BRCA-related cancers. BRCA-related cancers include:  Breast.  Ovarian.  Tubal.  Peritoneal cancers.  Results of the assessment will determine the need for genetic counseling and BRCA1 and BRCA2 testing. Cervical Cancer Routine pelvic examinations to screen for cervical cancer are no longer recommended for nonpregnant women who are considered low risk for cancer of the pelvic organs (ovaries, uterus, and vagina) and who do not have symptoms. A pelvic examination may be necessary if you have symptoms including those associated with pelvic infections. Ask your health care provider if a screening pelvic exam is right for you.   The Pap test is the screening test for cervical cancer for women who are considered at risk.  If you had a hysterectomy for a problem that was not cancer or a condition that could lead to cancer, then you no longer need Pap tests.  If you are older than 65 years, and you have had normal Pap tests for the past 10 years, you no longer need to have Pap tests.  If you have had past treatment for cervical cancer or a condition that could lead to cancer, you need Pap tests and screening for cancer for at least 20 years after your treatment.  If you no longer get a Pap test, assess your risk factors if they change (such as having a new sexual partner). This can affect whether you should start being screened again.  Some women have medical problems that increase their chance of getting cervical cancer. If this is the case for you, your health care provider may recommend more frequent screening and Pap tests.  The human papillomavirus (HPV) test is another test that may be used for cervical cancer screening. The HPV test looks for the virus that can cause cell changes in the cervix. The cells collected during the Pap test can be tested for HPV.  The HPV test can be used to screen women 11 years of age and older. Getting tested for HPV can extend the interval between normal  Pap tests from three to five years.  An HPV test also should be used to screen women of any age who have unclear Pap test results.  After 56 years of age, women should have HPV testing as often as Pap tests.  Colorectal Cancer  This type of cancer can be detected and often prevented.  Routine colorectal cancer screening usually begins at 56 years of age and continues through 56 years of age.  Your health care provider may recommend screening at an earlier age if you have risk factors for colon cancer.  Your health care provider may also recommend using home test kits to check for hidden blood in the stool.  A small camera at the end of a tube can be used to examine your colon directly (sigmoidoscopy or colonoscopy). This is done to check for the earliest forms of colorectal cancer.  Routine screening usually  begins at age 73.  Direct examination of the colon should be repeated every 5-10 years through 56 years of age. However, you may need to be screened more often if early forms of precancerous polyps or small growths are found. Skin Cancer  Check your skin from head to toe regularly.  Tell your health care provider about any new moles or changes in moles, especially if there is a change in a mole's shape or color.  Also tell your health care provider if you have a mole that is larger than the size of a pencil eraser.  Always use sunscreen. Apply sunscreen liberally and repeatedly throughout the day.  Protect yourself by wearing long sleeves, pants, a wide-brimmed hat, and sunglasses whenever you are outside. HEART DISEASE, DIABETES, AND HIGH BLOOD PRESSURE   Have your blood pressure checked at least every 1-2 years. High blood pressure causes heart disease and increases the risk of stroke.  If you are between 69 years and 71 years old, ask your health care provider if you should take aspirin to prevent strokes.  Have regular diabetes screenings. This involves taking a blood  sample to check your fasting blood sugar level.  If you are at a normal weight and have a low risk for diabetes, have this test once every three years after 56 years of age.  If you are overweight and have a high risk for diabetes, consider being tested at a younger age or more often. PREVENTING INFECTION  Hepatitis B  If you have a higher risk for hepatitis B, you should be screened for this virus. You are considered at high risk for hepatitis B if:  You were born in a country where hepatitis B is common. Ask your health care provider which countries are considered high risk.  Your parents were born in a high-risk country, and you have not been immunized against hepatitis B (hepatitis B vaccine).  You have HIV or AIDS.  You use needles to inject street drugs.  You live with someone who has hepatitis B.  You have had sex with someone who has hepatitis B.  You get hemodialysis treatment.  You take certain medicines for conditions, including cancer, organ transplantation, and autoimmune conditions. Hepatitis C  Blood testing is recommended for:  Everyone born from [birth year] through 1966.  Anyone with known risk factors for hepatitis C. Sexually transmitted infections (STIs)  You should be screened for sexually transmitted infections (STIs) including gonorrhea and chlamydia if:  You are sexually active and are younger than 56 years of age.  You are older than 56 years of age and your health care provider tells you that you are at risk for this type of infection.  Your sexual activity has changed since you were last screened and you are at an increased risk for chlamydia or gonorrhea. Ask your health care provider if you are at risk.  If you do not have HIV, but are at risk, it may be recommended that you take a prescription medicine daily to prevent HIV infection. This is called pre-exposure prophylaxis (PrEP). You are considered at risk if:  You are sexually active and do not  regularly use condoms or know the HIV status of your partner(s).  You take drugs by injection.  You are sexually active with a partner who has HIV. Talk with your health care provider about whether you are at high risk of being infected with HIV. If you choose to begin PrEP, you should first be tested for  HIV. You should then be tested every 3 months for as long as you are taking PrEP.  PREGNANCY   If you are premenopausal and you may become pregnant, ask your health care provider about preconception counseling.  If you may become pregnant, take 400 to 800 micrograms (mcg) of folic acid every day.  If you want to prevent pregnancy, talk to your health care provider about birth control (contraception). OSTEOPOROSIS AND MENOPAUSE   Osteoporosis is a disease in which the bones lose minerals and strength with aging. This can result in serious bone fractures. Your risk for osteoporosis can be identified using a bone density scan.  If you are 80 years of age or older, or if you are at risk for osteoporosis and fractures, ask your health care provider if you should be screened.  Ask your health care provider whether you should take a calcium or vitamin D supplement to lower your risk for osteoporosis.  Menopause may have certain physical symptoms and risks.  Hormone replacement therapy may reduce some of these symptoms and risks. Talk to your health care provider about whether hormone replacement therapy is right for you.  HOME CARE INSTRUCTIONS   Schedule regular health, dental, and eye exams.  Stay current with your immunizations.   Do not use any tobacco products including cigarettes, chewing tobacco, or electronic cigarettes.  If you are pregnant, do not drink alcohol.  If you are breastfeeding, limit how much and how often you drink alcohol.  Limit alcohol intake to no more than 1 drink per day for nonpregnant women. One drink equals 12 ounces of beer, 5 ounces of wine, or 1  ounces of hard liquor.  Do not use street drugs.  Do not share needles.  Ask your health care provider for help if you need support or information about quitting drugs.  Tell your health care provider if you often feel depressed.  Tell your health care provider if you have ever been abused or do not feel safe at home. Document Released: 12/09/2010 Document Revised: 10/10/2013 Document Reviewed: 04/27/2013 [facility] Patient Information 2015 [place], [place]. This information is not intended to replace advice given to you by your health care provider. Make sure you discuss any questions you have with your health care provider.

## 2016-01-09 NOTE — Progress Notes (Signed)
    Jolissa Doron 06-Oct-1960 [id number]        55 y.o.  [id number]  for annual exam.  Several issues noted below.  Past medical history,surgical history, problem list, medications, allergies, family history and social history were all reviewed and documented as reviewed in the EPIC chart.  ROS:  Performed with pertinent positives and negatives included in the history, assessment and plan.   Additional significant findings :  None   Exam: Caryn Bee assistant Vitals:   01/09/16 1134  BP: 124/78  Weight: 278 lb (126.1 kg)  Height: 5\' 4"  (1.626 m)   General appearance:  Normal affect, orientation and appearance. Skin: Grossly normal HEENT: Without gross lesions.  No cervical or supraclavicular adenopathy. Thyroid normal.  Lungs:  Clear without wheezing, rales or rhonchi Cardiac: RR, without RMG Abdominal:  Soft, nontender, without masses, guarding, rebound, organomegaly or hernia Breasts:  Examined lying and sitting. Right without masses, retractions, discharge or axillary adenopathy.  Left chest wall without masses or axillary adenopathy, status post mastectomy  Pelvic:  Ext/BUS/Vagina normal  Cervix normal  Uterus difficult to palpate but no gross masses or tenderness  Adnexa without gross masses or tenderness    Anus and perineum normal   Rectovaginal normal sphincter tone without palpated masses or tenderness.    Assessment/Plan:  55 y.o. [id number] female for annual exam   1. Postmenopausal. Without significant hot flushes, night sweats, vaginal dryness or any vaginal bleeding. Continue to monitor report any issues or bleeding. 2. Recent diagnosis right breast cancer. Already status post left mastectomy. Scheduled for mastectomy next week. Did recommend baseline DEXA now based on probable postoperative treatments and she will schedule and follow up for this sometime in the next several months after she recovers from her surgery. She was recently was also genetically tested based on  her bilateral diagnosis and family history. I did review with her if genetically positive then we would recommend prophylactic salpingo-oophorectomy and she understands and agrees to follow up with me for this. 3. Pap smear 2012. Pap smear done today. No history of significant abnormal Pap smears. 4. Colonoscopy 4 years ago. Repeat at their recommended interval. 5. Health maintenance. No routine lab work done as this is done elsewhere. Follow up for bone density as scheduled. Follow up for annual exam in one year.  Anastasio Auerbach MD, 11:59 AM 01/09/2016

## 2016-01-09 NOTE — Pre-Procedure Instructions (Addendum)
Diane Barnes  01/09/2016      [facility] [address] [place] [address] [place] [address] Phone: [phone] Fax: [phone]    Your procedure is scheduled on Thursday, January 17, 2016 at 9:45 AM.   Report to [facility] Entrance "A" Admitting Office at 7:45 AM.   Call this number if you have problems the morning of surgery: [phone]   Any questions prior to day of surgery, please call [phone] between 8 & 4 PM.   Remember:  Do not eat food or drink liquids after midnight Wednesday, 01/16/16.  Take these medicines the morning of surgery with A SIP OF WATER: Amlodipine (Norvasc)  Stop NSAIDS (Mobic, Meloxicam, Ibuprofen, Aleve, etc.) as of today.    Do not wear jewelry, make-up or nail polish.  Do not wear lotions, powders, or perfumes.  You may NOT wear deodorant.  Do not shave 48 hours prior to surgery.    Do not bring valuables to the hospital.  [facility] is not responsible for any belongings or valuables.  Contacts, dentures or bridgework may not be worn into surgery.  Leave your suitcase in the car.  After surgery it may be brought to your room.  For patients admitted to the hospital, discharge time will be determined by your treatment team.  Patients discharged the day of surgery will not be allowed to drive home.   Special instructions: [facility] - Preparing for Surgery  Before surgery, you can play an important role.  Because skin is not sterile, your skin needs to be as free of germs as possible.  You can reduce the number of germs on you skin by washing with CHG (chlorahexidine gluconate) soap before surgery.  CHG is an antiseptic cleaner which kills germs and bonds with the skin to continue killing germs even after washing.  Please DO NOT use if you have an allergy to CHG or antibacterial soaps.  If your skin becomes reddened/irritated stop using the CHG and inform your nurse when you  arrive at Short Stay.  Do not shave (including legs and underarms) for at least 48 hours prior to the first CHG shower.  You may shave your face.  Please follow these instructions carefully:   1.  Shower with CHG Soap the night before surgery and the   Morning of surgery  2.  If you choose to wash your hair, wash your hair first as usual with your  normal shampoo.  3.  After you shampoo, rinse your hair and body thoroughly to remove the shampoo.  4.  Use CHG as you would any other liquid soap.  You can apply chg directly to the skin and wash gently with scrungie or a clean washcloth.  5.  Apply the CHG Soap to your body ONLY FROM THE NECK DOWN.        Do not use on open wounds or open sores.  Avoid contact with your eyes, ears, mouth and genitals (private parts).  Wash genitals (private parts) with your normal soap.  6.  Wash thoroughly, paying special attention to the area where your surgery will be performed.  7.  Thoroughly rinse your body with warm water from the neck down.  8.  DO NOT shower/wash with your normal soap after using and rinsing off the CHG Soap.  9.  Pat yourself dry with a clean towel.  10.  Wear clean pajamas.            11.  Place clean sheets on your bed the night of your first shower and do not  sleep with pets.  Day of Surgery  Do not apply any lotions/deodorants the morning of surgery.  Please wear clean clothes to the hospital.    Please read over the following fact sheets that you were given. Pain Booklet, Coughing and Deep Breathing and Surgical Site Infection Prevention

## 2016-01-10 ENCOUNTER — Encounter (HOSPITAL_COMMUNITY)
Admission: RE | Admit: 2016-01-10 | Discharge: 2016-01-10 | Disposition: A | Discharge status: Home / Self Care | Payer: Managed Care, Other (non HMO) | Source: Ambulatory Visit | Attending: Surgery | Admitting: Surgery

## 2016-01-10 ENCOUNTER — Encounter (HOSPITAL_COMMUNITY): Payer: Self-pay

## 2016-01-10 ENCOUNTER — Other Ambulatory Visit: Payer: Self-pay

## 2016-01-10 DIAGNOSIS — Z01818 Encounter for other preprocedural examination: Secondary | ICD-10-CM | POA: Diagnosis present

## 2016-01-10 DIAGNOSIS — Z01812 Encounter for preprocedural laboratory examination: Secondary | ICD-10-CM | POA: Diagnosis not present

## 2016-01-10 DIAGNOSIS — C50911 Malignant neoplasm of unspecified site of right female breast: Secondary | ICD-10-CM | POA: Diagnosis not present

## 2016-01-10 HISTORY — DX: Major depressive disorder, single episode, unspecified: F32.9

## 2016-01-10 HISTORY — DX: Depression, unspecified: F32.A

## 2016-01-10 HISTORY — DX: Unspecified osteoarthritis, unspecified site: M19.90

## 2016-01-10 HISTORY — DX: Anxiety disorder, unspecified: F41.9

## 2016-01-10 LAB — BASIC METABOLIC PANEL
Anion gap: 8 (ref 5–15)
BUN: 11 mg/dL (ref 6–20)
CALCIUM: 9.3 mg/dL (ref 8.9–10.3)
CO2: 29 mmol/L (ref 22–32)
CREATININE: 0.86 mg/dL (ref 0.44–1.00)
Chloride: 102 mmol/L (ref 101–111)
Glucose, Bld: 97 mg/dL (ref 65–99)
Potassium: 3.7 mmol/L (ref 3.5–5.1)
SODIUM: 139 mmol/L (ref 135–145)

## 2016-01-10 LAB — CBC
HCT: 41.4 % (ref 36.0–46.0)
Hemoglobin: 13.4 g/dL (ref 12.0–15.0)
MCH: 28.5 pg (ref 26.0–34.0)
MCHC: 32.4 g/dL (ref 30.0–36.0)
MCV: 87.9 fL (ref 78.0–100.0)
Platelets: 278 10*3/uL (ref 150–400)
RBC: 4.71 MIL/uL (ref 3.87–5.11)
RDW: 13.1 % (ref 11.5–15.5)
WBC: 6.6 10*3/uL (ref 4.0–10.5)

## 2016-01-10 LAB — PAP IG W/ RFLX HPV ASCU

## 2016-01-10 NOTE — Progress Notes (Signed)
Pt. States that she has not has any previous cardiac testing.No complaints of chest pain or discomfort.

## 2016-01-16 MED ORDER — CELECOXIB 200 MG PO CAPS
400.0000 mg | ORAL_CAPSULE | ORAL | Status: DC
  Filled 2016-01-16: qty 2

## 2016-01-16 MED ORDER — GABAPENTIN 300 MG PO CAPS
300.0000 mg | ORAL_CAPSULE | ORAL | Status: DC
  Filled 2016-01-16: qty 1

## 2016-01-16 MED ORDER — ACETAMINOPHEN 500 MG PO TABS
1000.0000 mg | ORAL_TABLET | ORAL | Status: DC
  Filled 2016-01-16: qty 2

## 2016-01-16 MED ORDER — DEXTROSE 5 % IV SOLN
3.0000 g | INTRAVENOUS | Status: AC
  Administered 2016-01-17: 2 g via INTRAVENOUS
  Filled 2016-01-16 (×2): qty 3000

## 2016-01-16 NOTE — Anesthesia Preprocedure Evaluation (Addendum)
Anesthesia Evaluation  Patient identified by MRN, date of birth, ID band Patient awake    Reviewed: Allergy & Precautions, NPO status , Patient's Chart, lab work & pertinent test results  History of Anesthesia Complications (+) PONV and history of anesthetic complications  Airway Mallampati: III  TM Distance: >3 FB Neck ROM: Full    Dental  (+) Dental Advisory Given, Teeth Intact   Pulmonary neg pulmonary ROS, neg shortness of breath, neg sleep apnea, neg COPD, neg recent URI,    Pulmonary exam normal breath sounds clear to auscultation       Cardiovascular hypertension, Pt. on medications (-) angina(-) Past MI, (-) Cardiac Stents, (-) CABG and (-) Orthopnea  Rhythm:Regular Rate:Normal     Neuro/Psych PSYCHIATRIC DISORDERS Anxiety Depression negative neurological ROS     GI/Hepatic negative GI ROS, Neg liver ROS,   Endo/Other  neg diabetesMorbid obesity  Renal/GU negative Renal ROS     Musculoskeletal  (+) Arthritis ,   Abdominal   Peds  Hematology negative hematology ROS (+)   Anesthesia Other Findings Right breast cancer, h/o left breast cancer s/p mastectomy  Reproductive/Obstetrics                           Anesthesia Physical Anesthesia Plan  ASA: III  Anesthesia Plan: General   Post-op Pain Management:    Induction: Intravenous  Airway Management Planned: Oral ETT and LMA  Additional Equipment:   Intra-op Plan:   Post-operative Plan: Extubation in OR  Informed Consent: I have reviewed the patients History and Physical, chart, labs and discussed the procedure including the risks, benefits and alternatives for the proposed anesthesia with the patient or authorized representative who has indicated his/her understanding and acceptance.   Dental advisory given  Plan Discussed with:   Anesthesia Plan Comments: (Discussed recommendation, benefits, and risks of nerve block  with the patient. The patient states that she has had 2 previous breast surgeries without a nerve block that did not require her to take pain medication. She does not want a nerve block and declines to consent for possible post-op nerve block.)      Anesthesia Quick Evaluation

## 2016-01-17 ENCOUNTER — Ambulatory Visit: Payer: Managed Care, Other (non HMO)

## 2016-01-17 ENCOUNTER — Ambulatory Visit (HOSPITAL_COMMUNITY): Payer: Managed Care, Other (non HMO) | Admitting: Anesthesiology

## 2016-01-17 ENCOUNTER — Observation Stay (HOSPITAL_COMMUNITY)
Admission: RE | Admit: 2016-01-17 | Discharge: 2016-01-18 | Disposition: A | Discharge status: Home / Self Care | Payer: Managed Care, Other (non HMO) | Source: Ambulatory Visit | Attending: Surgery | Admitting: Surgery

## 2016-01-17 ENCOUNTER — Encounter (HOSPITAL_COMMUNITY)
Admission: RE | Disposition: A | Discharge status: Home / Self Care | Payer: Self-pay | Source: Ambulatory Visit | Attending: Surgery

## 2016-01-17 ENCOUNTER — Ambulatory Visit (HOSPITAL_COMMUNITY): Payer: Managed Care, Other (non HMO) | Admitting: Emergency Medicine

## 2016-01-17 ENCOUNTER — Encounter (HOSPITAL_COMMUNITY)
Admission: RE | Admit: 2016-01-17 | Discharge: 2016-01-17 | Disposition: A | Discharge status: Home / Self Care | Payer: Managed Care, Other (non HMO) | Source: Ambulatory Visit | Attending: Surgery | Admitting: Surgery

## 2016-01-17 ENCOUNTER — Encounter (HOSPITAL_COMMUNITY): Payer: Self-pay | Admitting: Certified Registered Nurse Anesthetist

## 2016-01-17 DIAGNOSIS — C50911 Malignant neoplasm of unspecified site of right female breast: Principal | ICD-10-CM | POA: Diagnosis present

## 2016-01-17 DIAGNOSIS — D0511 Intraductal carcinoma in situ of right breast: Secondary | ICD-10-CM | POA: Insufficient documentation

## 2016-01-17 DIAGNOSIS — Z79899 Other long term (current) drug therapy: Secondary | ICD-10-CM | POA: Diagnosis not present

## 2016-01-17 DIAGNOSIS — Z170 Estrogen receptor positive status [ER+]: Secondary | ICD-10-CM | POA: Diagnosis not present

## 2016-01-17 DIAGNOSIS — Z6841 Body Mass Index (BMI) 40.0 and over, adult: Secondary | ICD-10-CM | POA: Insufficient documentation

## 2016-01-17 DIAGNOSIS — I10 Essential (primary) hypertension: Secondary | ICD-10-CM | POA: Insufficient documentation

## 2016-01-17 DIAGNOSIS — Z853 Personal history of malignant neoplasm of breast: Secondary | ICD-10-CM | POA: Diagnosis not present

## 2016-01-17 HISTORY — PX: SIMPLE MASTECTOMY WITH AXILLARY SENTINEL NODE BIOPSY: SHX6098

## 2016-01-17 LAB — CREATININE, SERUM
CREATININE: 0.87 mg/dL (ref 0.44–1.00)
GFR calc Af Amer: >60 mL/min (ref >60)
GFR calc non Af Amer: >60 mL/min (ref >60)

## 2016-01-17 LAB — CBC
HCT: 41.5 % (ref 36.0–46.0)
Hemoglobin: 13.7 g/dL (ref 12.0–15.0)
MCH: 29 pg (ref 26.0–34.0)
MCHC: 33 g/dL (ref 30.0–36.0)
MCV: 87.7 fL (ref 78.0–100.0)
PLATELETS: 261 10*3/uL (ref 150–400)
RBC: 4.73 MIL/uL (ref 3.87–5.11)
RDW: 13.1 % (ref 11.5–15.5)
WBC: 10.5 10*3/uL (ref 4.0–10.5)

## 2016-01-17 SURGERY — SIMPLE MASTECTOMY WITH AXILLARY SENTINEL NODE BIOPSY
Anesthesia: General | Site: Breast | Laterality: Right

## 2016-01-17 MED ORDER — AMLODIPINE BESYLATE 10 MG PO TABS
10.0000 mg | ORAL_TABLET | Freq: Every day | ORAL | Status: DC
  Administered 2016-01-18: 10 mg via ORAL
  Filled 2016-01-17: qty 1

## 2016-01-17 MED ORDER — DEXAMETHASONE SODIUM PHOSPHATE 10 MG/ML IJ SOLN
INTRAMUSCULAR | Status: DC | PRN
  Administered 2016-01-17: 10 mg via INTRAVENOUS

## 2016-01-17 MED ORDER — PROMETHAZINE HCL 25 MG/ML IJ SOLN: 6.2500 mg | INTRAMUSCULAR | Status: DC | PRN

## 2016-01-17 MED ORDER — HYDRALAZINE HCL 20 MG/ML IJ SOLN: 10.0000 mg | INTRAMUSCULAR | Status: DC | PRN

## 2016-01-17 MED ORDER — HYDROMORPHONE HCL 1 MG/ML IJ SOLN: 1.0000 mg | INTRAMUSCULAR | Status: DC | PRN

## 2016-01-17 MED ORDER — FENTANYL CITRATE (PF) 100 MCG/2ML IJ SOLN
INTRAMUSCULAR | Status: DC | PRN
  Administered 2016-01-17: 50 ug via INTRAVENOUS
  Administered 2016-01-17 (×2): 25 ug via INTRAVENOUS
  Administered 2016-01-17: 50 ug via INTRAVENOUS
  Administered 2016-01-17: 25 ug via INTRAVENOUS
  Administered 2016-01-17: 50 ug via INTRAVENOUS
  Administered 2016-01-17: 25 ug via INTRAVENOUS

## 2016-01-17 MED ORDER — CHLORHEXIDINE GLUCONATE CLOTH 2 % EX PADS: 6.0000 | MEDICATED_PAD | Freq: Once | CUTANEOUS | Status: DC

## 2016-01-17 MED ORDER — EPHEDRINE SULFATE-NACL 50-0.9 MG/10ML-% IV SOSY
PREFILLED_SYRINGE | INTRAVENOUS | Status: DC | PRN
  Administered 2016-01-17 (×3): 5 mg via INTRAVENOUS

## 2016-01-17 MED ORDER — MIDAZOLAM HCL 2 MG/2ML IJ SOLN
1.0000 mg | Freq: Once | INTRAMUSCULAR | Status: AC
  Administered 2016-01-17: 1 mg via INTRAVENOUS

## 2016-01-17 MED ORDER — ACETAMINOPHEN 325 MG PO TABS: 650.0000 mg | ORAL_TABLET | Freq: Four times a day (QID) | ORAL | Status: DC | PRN

## 2016-01-17 MED ORDER — PHENYLEPHRINE HCL 10 MG/ML IJ SOLN
INTRAVENOUS | Status: DC | PRN
  Administered 2016-01-17: 10 ug/min via INTRAVENOUS

## 2016-01-17 MED ORDER — ONDANSETRON HCL 4 MG/2ML IJ SOLN
INTRAMUSCULAR | Status: DC | PRN
  Administered 2016-01-17 (×2): 4 mg via INTRAVENOUS

## 2016-01-17 MED ORDER — SUCCINYLCHOLINE CHLORIDE 200 MG/10ML IV SOSY
PREFILLED_SYRINGE | INTRAVENOUS | Status: AC
  Filled 2016-01-17: qty 10

## 2016-01-17 MED ORDER — MIDAZOLAM HCL 2 MG/2ML IJ SOLN
INTRAMUSCULAR | Status: AC
  Administered 2016-01-17: 1 mg via INTRAVENOUS
  Filled 2016-01-17: qty 2

## 2016-01-17 MED ORDER — LIDOCAINE 2% (20 MG/ML) 5 ML SYRINGE
INTRAMUSCULAR | Status: AC
  Filled 2016-01-17: qty 10

## 2016-01-17 MED ORDER — PROPOFOL 10 MG/ML IV BOLUS
INTRAVENOUS | Status: AC
  Filled 2016-01-17: qty 20

## 2016-01-17 MED ORDER — ONDANSETRON HCL 4 MG/2ML IJ SOLN
4.0000 mg | Freq: Four times a day (QID) | INTRAMUSCULAR | Status: DC | PRN
  Administered 2016-01-17: 4 mg via INTRAVENOUS
  Filled 2016-01-17: qty 2

## 2016-01-17 MED ORDER — ZOLPIDEM TARTRATE 5 MG PO TABS: 5.0000 mg | ORAL_TABLET | Freq: Every evening | ORAL | Status: DC | PRN

## 2016-01-17 MED ORDER — FENTANYL CITRATE (PF) 100 MCG/2ML IJ SOLN: 25.0000 ug | INTRAMUSCULAR | Status: DC | PRN

## 2016-01-17 MED ORDER — CEFAZOLIN SODIUM-DEXTROSE 2-4 GM/100ML-% IV SOLN
2.0000 g | Freq: Three times a day (TID) | INTRAVENOUS | Status: AC
  Administered 2016-01-17: 2 g via INTRAVENOUS
  Filled 2016-01-17: qty 100

## 2016-01-17 MED ORDER — ACETAMINOPHEN 650 MG RE SUPP
650.0000 mg | Freq: Four times a day (QID) | RECTAL | Status: DC | PRN
Start: 2016-01-17 — End: 2016-01-18

## 2016-01-17 MED ORDER — LACTATED RINGERS IV SOLN
INTRAVENOUS | Status: DC
  Administered 2016-01-17 (×2): via INTRAVENOUS

## 2016-01-17 MED ORDER — FENTANYL CITRATE (PF) 250 MCG/5ML IJ SOLN
INTRAMUSCULAR | Status: AC
  Filled 2016-01-17: qty 5

## 2016-01-17 MED ORDER — PHENYLEPHRINE 40 MCG/ML (10ML) SYRINGE FOR IV PUSH (FOR BLOOD PRESSURE SUPPORT)
PREFILLED_SYRINGE | INTRAVENOUS | Status: DC | PRN
  Administered 2016-01-17 (×2): 40 ug via INTRAVENOUS

## 2016-01-17 MED ORDER — LIDOCAINE 2% (20 MG/ML) 5 ML SYRINGE
INTRAMUSCULAR | Status: DC | PRN
  Administered 2016-01-17: 100 mg via INTRAVENOUS

## 2016-01-17 MED ORDER — SIMETHICONE 80 MG PO CHEW: 40.0000 mg | CHEWABLE_TABLET | Freq: Four times a day (QID) | ORAL | Status: DC | PRN

## 2016-01-17 MED ORDER — PROPOFOL 10 MG/ML IV BOLUS
INTRAVENOUS | Status: DC | PRN
  Administered 2016-01-17: 200 mg via INTRAVENOUS
  Administered 2016-01-17: 50 mg via INTRAVENOUS
  Administered 2016-01-17: 30 mg via INTRAVENOUS
  Administered 2016-01-17: 50 mg via INTRAVENOUS

## 2016-01-17 MED ORDER — TECHNETIUM TC 99M SULFUR COLLOID FILTERED
1.0000 | Freq: Once | INTRAVENOUS | Status: AC | PRN
Start: 2016-01-17 — End: 2016-01-17
  Administered 2016-01-17: 1 via INTRADERMAL

## 2016-01-17 MED ORDER — 0.9 % SODIUM CHLORIDE (POUR BTL) OPTIME
TOPICAL | Status: DC | PRN
  Administered 2016-01-17 (×2): 1000 mL

## 2016-01-17 MED ORDER — ONDANSETRON 4 MG PO TBDP: 4.0000 mg | ORAL_TABLET | Freq: Four times a day (QID) | ORAL | Status: DC | PRN

## 2016-01-17 MED ORDER — OXYCODONE HCL 5 MG PO TABS: 5.0000 mg | ORAL_TABLET | ORAL | Status: DC | PRN

## 2016-01-17 MED ORDER — KCL IN DEXTROSE-NACL 20-5-0.9 MEQ/L-%-% IV SOLN
INTRAVENOUS | Status: DC
  Administered 2016-01-17: 13:00:00 via INTRAVENOUS
  Filled 2016-01-17 (×2): qty 1000

## 2016-01-17 MED ORDER — SCOPOLAMINE 1 MG/3DAYS TD PT72
1.0000 | MEDICATED_PATCH | TRANSDERMAL | Status: DC
  Filled 2016-01-17: qty 1

## 2016-01-17 MED ORDER — PHENYLEPHRINE 40 MCG/ML (10ML) SYRINGE FOR IV PUSH (FOR BLOOD PRESSURE SUPPORT)
PREFILLED_SYRINGE | INTRAVENOUS | Status: AC
  Filled 2016-01-17: qty 10

## 2016-01-17 MED ORDER — ONDANSETRON HCL 4 MG/2ML IJ SOLN
INTRAMUSCULAR | Status: AC
  Filled 2016-01-17: qty 8

## 2016-01-17 MED ORDER — DEXAMETHASONE SODIUM PHOSPHATE 10 MG/ML IJ SOLN
INTRAMUSCULAR | Status: AC
  Filled 2016-01-17: qty 3

## 2016-01-17 MED ORDER — EPHEDRINE 5 MG/ML INJ
INTRAVENOUS | Status: AC
  Filled 2016-01-17: qty 10

## 2016-01-17 MED ORDER — METHOCARBAMOL 500 MG PO TABS: 500.0000 mg | ORAL_TABLET | Freq: Four times a day (QID) | ORAL | Status: DC | PRN

## 2016-01-17 MED ORDER — ENOXAPARIN SODIUM 40 MG/0.4ML ~~LOC~~ SOLN
40.0000 mg | SUBCUTANEOUS | Status: DC
  Administered 2016-01-18: 40 mg via SUBCUTANEOUS
  Filled 2016-01-17: qty 0.4

## 2016-01-17 MED ORDER — MIDAZOLAM HCL 2 MG/2ML IJ SOLN
INTRAMUSCULAR | Status: AC
  Filled 2016-01-17: qty 2

## 2016-01-17 SURGICAL SUPPLY — 42 items
APPLIER CLIP 9.375 MED OPEN (MISCELLANEOUS) ×2
APR CLP MED 9.3 20 MLT OPN (MISCELLANEOUS) ×1
BINDER BREAST XXLRG (GAUZE/BANDAGES/DRESSINGS) ×1 IMPLANT
CANISTER SUCTION 2500CC (MISCELLANEOUS) ×2 IMPLANT
CHLORAPREP W/TINT 26ML (MISCELLANEOUS) ×2 IMPLANT
CLIP APPLIE 9.375 MED OPEN (MISCELLANEOUS) ×1 IMPLANT
CONT SPEC 4OZ CLIKSEAL STRL BL (MISCELLANEOUS) ×2 IMPLANT
COVER PROBE W GEL 5X96 (DRAPES) ×2 IMPLANT
COVER SURGICAL LIGHT HANDLE (MISCELLANEOUS) ×2 IMPLANT
DRAIN CHANNEL 19F RND (DRAIN) ×3 IMPLANT
DRAPE CHEST BREAST 15X10 FENES (DRAPES) ×2 IMPLANT
DRSG PAD ABDOMINAL 8X10 ST (GAUZE/BANDAGES/DRESSINGS) ×2 IMPLANT
ELECT CAUTERY BLADE 6.4 (BLADE) ×2 IMPLANT
ELECT REM PT RETURN 9FT ADLT (ELECTROSURGICAL) ×2
ELECTRODE REM PT RTRN 9FT ADLT (ELECTROSURGICAL) ×1 IMPLANT
EVACUATOR SILICONE 100CC (DRAIN) ×3 IMPLANT
GAUZE SPONGE 4X4 12PLY STRL (GAUZE/BANDAGES/DRESSINGS) ×1 IMPLANT
GLOVE BIO SURGEON STRL SZ 6 (GLOVE) ×3 IMPLANT
GLOVE BIO SURGEON STRL SZ7.5 (GLOVE) ×1 IMPLANT
GLOVE BIO SURGEON STRL SZ8 (GLOVE) ×2 IMPLANT
GLOVE BIOGEL PI IND STRL 6 (GLOVE) IMPLANT
GLOVE BIOGEL PI IND STRL 7.5 (GLOVE) IMPLANT
GLOVE BIOGEL PI IND STRL 8 (GLOVE) ×1 IMPLANT
GLOVE BIOGEL PI INDICATOR 6 (GLOVE) ×2
GLOVE BIOGEL PI INDICATOR 7.5 (GLOVE) ×1
GLOVE BIOGEL PI INDICATOR 8 (GLOVE) ×1
GOWN STRL REUS W/ TWL LRG LVL3 (GOWN DISPOSABLE) ×3 IMPLANT
GOWN STRL REUS W/ TWL XL LVL3 (GOWN DISPOSABLE) ×1 IMPLANT
GOWN STRL REUS W/TWL LRG LVL3 (GOWN DISPOSABLE) ×4
GOWN STRL REUS W/TWL XL LVL3 (GOWN DISPOSABLE) ×2
KIT BASIN OR (CUSTOM PROCEDURE TRAY) ×2 IMPLANT
KIT ROOM TURNOVER OR (KITS) ×2 IMPLANT
LIQUID BAND (GAUZE/BANDAGES/DRESSINGS) ×2 IMPLANT
NS IRRIG 1000ML POUR BTL (IV SOLUTION) ×3 IMPLANT
PACK GENERAL/GYN (CUSTOM PROCEDURE TRAY) ×2 IMPLANT
PAD ARMBOARD 7.5X6 YLW CONV (MISCELLANEOUS) ×2 IMPLANT
SPECIMEN JAR X LARGE (MISCELLANEOUS) ×2 IMPLANT
SUT ETHILON 3 0 FSL (SUTURE) ×3 IMPLANT
SUT MNCRL AB 4-0 PS2 18 (SUTURE) ×3 IMPLANT
SUT VIC AB 3-0 SH 18 (SUTURE) ×2 IMPLANT
TAPE CLOTH SURG 6X10 WHT LF (GAUZE/BANDAGES/DRESSINGS) ×1 IMPLANT
TOWEL OR 17X26 10 PK STRL BLUE (TOWEL DISPOSABLE) ×2 IMPLANT

## 2016-01-17 NOTE — Transfer of Care (Signed)
Immediate Anesthesia Transfer of Care Note  Patient: Diane Barnes  Procedure(s) Performed: Procedure(s): RIGHT SIMPLE MASTECTOMY WITH AXILLARY SENTINEL NODE BIOPSY (Right)  Patient Location: PACU  Anesthesia Type:General  Level of Consciousness: awake, alert , oriented and patient cooperative  Airway & Oxygen Therapy: Patient Spontanous Breathing and Patient connected to nasal cannula oxygen  Post-op Assessment: Report given to RN, Post -op Vital signs reviewed and stable and Patient moving all extremities  Post vital signs: Reviewed and stable  Last Vitals:  Vitals:   01/17/16 0940 01/17/16 0941  BP:  (!) 152/82  Pulse: 64 69  Resp: 10 14  Temp:      Last Pain:  Vitals:   01/17/16 0820  TempSrc: Oral         Complications: No apparent anesthesia complications

## 2016-01-17 NOTE — Anesthesia Postprocedure Evaluation (Signed)
Anesthesia Post Note  Patient: Allahna Merida  Procedure(s) Performed: Procedure(s) (LRB): RIGHT SIMPLE MASTECTOMY WITH AXILLARY SENTINEL NODE BIOPSY (Right)  Patient location during evaluation: PACU Anesthesia Type: General Level of consciousness: awake and alert Pain management: pain level controlled Vital Signs Assessment: post-procedure vital signs reviewed and stable Respiratory status: spontaneous breathing, nonlabored ventilation and respiratory function stable Cardiovascular status: blood pressure returned to baseline and stable Postop Assessment: no signs of nausea or vomiting Anesthetic complications: no    Last Vitals:  Vitals:   01/17/16 1221 01/17/16 1356  BP: (!) 182/76 (!) 166/73  Pulse: 77 80  Resp: 18 17  Temp: 37.2 C 37 C    Last Pain:  Vitals:   01/17/16 1356  TempSrc: Oral  PainSc:                  Nilda Simmer

## 2016-01-17 NOTE — Interval H&P Note (Signed)
History and Physical Interval Note:  01/17/2016 9:22 AM  Diane Barnes  has presented today for surgery, with the diagnosis of RIGHT BREAST CANCER  The various methods of treatment have been discussed with the patient and family. After consideration of risks, benefits and other options for treatment, the patient has consented to  Procedure(s): RIGHT SIMPLE MASTECTOMY WITH AXILLARY SENTINEL NODE BIOPSY (Right) as a surgical intervention .  The patient's history has been reviewed, patient examined, no change in status, stable for surgery.  I have reviewed the patient's chart and labs.  Questions were answered to the patient's satisfaction.     Sujey Gundry A.

## 2016-01-17 NOTE — Progress Notes (Signed)
Pt admitted to the unit from PACU. Pt is stable, alert and oriented per baseline. Oriented to room, staff, and call bell. Educated to call for any assistance. Bed in lowest position, call bell within reach- will continue to monitor.

## 2016-01-17 NOTE — Anesthesia Procedure Notes (Signed)
Procedure Name: LMA Insertion Date/Time: 01/17/2016 10:12 AM Performed by: Merdis Delay Pre-anesthesia Checklist: Patient identified, Emergency Drugs available, Suction available, Patient being monitored and Timeout performed Patient Re-evaluated:Patient Re-evaluated prior to inductionOxygen Delivery Method: Circle system utilized Preoxygenation: Pre-oxygenation with 100% oxygen Intubation Type: IV induction LMA: LMA inserted LMA Size: 4.0 Number of attempts: 2 Placement Confirmation: positive ETCO2,  CO2 detector and breath sounds checked- equal and bilateral Tube secured with: Tape Dental Injury: Teeth and Oropharynx as per pre-operative assessment  Comments: LMA 4 malpositioned- removed and then re-inserted atraumatically.

## 2016-01-17 NOTE — Op Note (Signed)
Ariza [facility] [birth year] [id number] 01/17/2016   Preoperative diagnosis: Multifocal right breast cancer  Postoperative diagnosis: Same   Procedure: Right simple mastectomy with sentinel lymph node mapping  Surgeon: Quante Pettry A.  Pensions consultant: none  Anesthesia:General  Clinical History and Indications:The patient is seen in the holding area and we reviewed the plans for the procedure as noted above. We reviewed the risks and complications a final time. She had no further questions. I marked theright side as the operative side. The surgical and non surgical options have been discussed with the patient.  Risks of surgery include bleeding,  Infection,  Flap necrosis,  Tissue loss,  Chronic pain, death, Numbness,  And the need for additional procedures.  Reconstruction options also have been discussed with the patient as well.  The patient agrees to proceed.  Description of Procedure: The patient was taken to the operating room. After satisfactory general anesthesia was obtained the timeout was done.   A full prep and drape was then done.  I outlined an elliptical incision and marked the inframammary fold and midline. The incision was made. The usual skin flaps were raised. I went to the clavicle superiorly and sternum medially and towards the latissimus laterally.    After the superior flap was complete I was able to use the neoprobe to locate a sentinel node.  1 right axilla node  was  removed. Background counts approached 0.  Once the node was removed it was forwarded to pathology.   The inferior flap was then made going to the inframammary fold and out to the latissimus. The breast was then removed from the pectoralis starting medially and working laterally. When I got to the lateral aspect of the pectoralis major muscle I opened the clavipectoral fascia. I finished removing the breast from the serratus muscles.    I therefore completed the mastectomy by dividing the  remaining attachments from the breast to the chest wall and latissimus but not taking any more tissue from the axilla. The specimen was marked to orient it for the pathologist and passed off the table.  I spent several minutes irrigating and making sure everything was dry. I then placed two  19 [place] Blake drain through a small incisions in the inferior flap and laid along the pectoralis muscle. It was secured with a 2-0 nylon suture.  Another irrigation and check for hemostasis was made. Everything appeared to be dry. Incision was then closed with 3-0 Vicryl and 4-0 Monocryl. A liquid adhesive applied.  The patient tolerated the procedure well. There were no operative complications. All counts were correct. Estimated blood loss was 100 cc. Sterile dressings were applied and the patient taken to the PACU in satisfactory condition.  Turner Daniels, MD, FACS 01/17/2016 11:20 AM

## 2016-01-17 NOTE — H&P (Signed)
H&P Encounter Date:  Diane Luna, MD  Surgery    Diane Barnes 12/10/2015 2:39 PM Location: [facility] Patient #: [id number] DOB: 26-Apr-1961 Married / Language: English / Race: Black or African American Female  History of Present Illness Diane Moores A. Theodis Kinsel MD 4:15 PM) Patient words: Patient returns for follow-up after diagnosis of right breast cancer. MRI was done which shows multifocal disease. 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 She is recovered from this. She is here to discuss surgical options.  The patient is a 55 year old female.   Allergies Diane Barnes, CMA;  Sesame Seed (Diagnostic) *DIAGNOSTIC PRODUCTS* Sesame Oil *CHEMICALS*  Medication History Diane Barnes, CMA;  2:39 PM) AmLODIPine Besylate (5MG  Tablet, Oral) Active. EPINEPHrine (0.15MG /0.15ML Soln Auto-inj, Injection) Active. Meloxicam (15MG  Tablet, Oral) Active. Medications Reconciled    Vitals Diane Barnes CMA;  2:40 PM) 12/10/2015 2:39 PM Weight: 280 lb Height: 64in Body Surface Area: 2.26 m Body Mass Index: 48.06 kg/m  Temp.: 98.9F  Pulse: 74 (Regular)  BP: 140/82 (Sitting, Left Arm, Standard)      Physical Exam (Diane Barnes A. Mairin Lindsley MD; 4:16 PM)  General Mental Status-Alert. General Appearance-Consistent with stated age. Hydration-Well hydrated. Voice-Normal.  Breast Note: Left breast surgically absent. Right breast shows multiple bruising from multiple biopsy sites.    Assessment & Plan (Diane Barnes A. Khiley Lieser MD;  4:17 PM)  BREAST CANCER, RIGHT (C50.911) Impression: Given the amount of disease and right breasts are recommend a right simple mastectomy with right axillary sentinel lymph node mapping. She has no interest reconstruction at this point. Discussed treatment options for breast cancer to include breast conservation vs mastectomy with reconstruction. Pt has decided on mastectomy. Risk include bleeding, infection,  flap necrosis, pain, numbness, recurrence, hematoma, other surgery needs. Pt understands and agrees to proceed. Risk of sentinel lymph node mapping include bleeding, infection, lymphedema, shoulder pain. stiffness, dye allergy. cosmetic deformity , blood clots, death, need for more surgery. Pt agres to proceed.  Current Plans You are being scheduled for surgery - Our schedulers will call you.  You should hear from our office's scheduling department within 5 working days about the location, date, and time of surgery. We try to make accommodations for patient's preferences in scheduling surgery, but sometimes the OR schedule or the surgeon's schedule prevents [place] from making those accommodations.  If you have not heard from our office [phone]) in 5 working days, call the office and ask for your surgeon's nurse.  If you have other questions about your diagnosis, plan, or surgery, call the office and ask for your surgeon's nurse.  We discussed the staging and pathophysiology of breast cancer. We discussed all of the different options for treatment for breast cancer including surgery, chemotherapy, radiation therapy, Herceptin, and antiestrogen therapy. We discussed a sentinel lymph node biopsy as she does not appear to having lymph node involvement right now. We discussed the performance of that with injection of radioactive tracer and blue dye. We discussed that she would have an incision underneath her axillary hairline. We discussed that there is a bout a 10-20% chance of having a positive node with a sentinel lymph node biopsy and we will await the permanent pathology to make any other first further decisions in terms of her treatment. One of these options might be to return to the operating room to perform an axillary lymph node dissection. We discussed about a 1-2% risk lifetime of chronic shoulder pain as well as lymphedema associated with a  sentinel lymph node biopsy. We discussed the  options for treatment of the breast cancer which included lumpectomy versus a mastectomy. We discussed the performance of the lumpectomy with a wire placement. We discussed a 10-20% chance of a positive margin requiring reexcision in the operating room. We also discussed that she may need radiation therapy or antiestrogen therapy or both if she undergoes lumpectomy. We discussed the mastectomy and the postoperative care for that as well. We discussed that there is no difference in her survival whether she undergoes lumpectomy with radiation therapy or antiestrogen therapy versus a mastectomy. There is a slight difference in the local recurrence rate being 3-5% with lumpectomy and about 1% with a mastectomy. We discussed the risks of operation including bleeding, infection, possible reoperation. She understands her further therapy will be based on what her stages at the time of her operation.  Pt Education - flb breast cancer surgery: discussed with patient and provided information. Pt Education - CCS Mastectomy HCI HISTORY OF BREAST CANCER (Z85.3)       Pre-op/Pre-procedure Orders on 12/10/2015        Routing History        Detailed Report

## 2016-01-18 ENCOUNTER — Telehealth: Payer: Self-pay | Admitting: Genetic Counselor

## 2016-01-18 ENCOUNTER — Encounter (HOSPITAL_COMMUNITY): Payer: Self-pay | Admitting: Surgery

## 2016-01-18 DIAGNOSIS — C50911 Malignant neoplasm of unspecified site of right female breast: Secondary | ICD-10-CM | POA: Diagnosis not present

## 2016-01-18 LAB — BASIC METABOLIC PANEL
ANION GAP: 8 (ref 5–15)
BUN: 11 mg/dL (ref 6–20)
CALCIUM: 9 mg/dL (ref 8.9–10.3)
CO2: 25 mmol/L (ref 22–32)
Chloride: 104 mmol/L (ref 101–111)
Creatinine, Ser: 0.92 mg/dL (ref 0.44–1.00)
GFR calc Af Amer: >60 mL/min (ref >60)
Glucose, Bld: 172 mg/dL — ABNORMAL HIGH (ref 65–99)
POTASSIUM: 4.3 mmol/L (ref 3.5–5.1)
SODIUM: 137 mmol/L (ref 135–145)

## 2016-01-18 LAB — CBC
HCT: 39.8 % (ref 36.0–46.0)
Hemoglobin: 13 g/dL (ref 12.0–15.0)
MCH: 28.6 pg (ref 26.0–34.0)
MCHC: 32.7 g/dL (ref 30.0–36.0)
MCV: 87.7 fL (ref 78.0–100.0)
PLATELETS: 261 10*3/uL (ref 150–400)
RBC: 4.54 MIL/uL (ref 3.87–5.11)
RDW: 13.3 % (ref 11.5–15.5)
WBC: 16.3 10*3/uL — AB (ref 4.0–10.5)

## 2016-01-18 MED ORDER — ONDANSETRON 4 MG PO TBDP: 4.0000 mg | ORAL_TABLET | Freq: Four times a day (QID) | ORAL | 0 refills | Status: DC | PRN

## 2016-01-18 NOTE — Discharge Summary (Signed)
Physician Discharge Summary  Patient ID: Diane Barnes MRN: [id number] DOB/AGE: 56-Oct-1962 56 y.o.  Admit date: 01/17/2016 Discharge date: 01/18/2016  Admission Diagnoses:RIGHT BREAST CANCER   Discharge Diagnoses:  Active Problems:   Breast cancer, female, right   Discharged Condition: good  Hospital Course: Pt did well .   She had no pain and tolerating her diet.  Wounds were clean and drainage was expected       Treatments: surgery: right mastectomy   Discharge Exam: Blood pressure (!) 173/80, pulse 70, temperature 98.6 F (37 C), temperature source Oral, resp. rate 18, height 5\' 4"  (1.626 m), weight 124.8 kg (275 lb 3.2 oz), SpO2 100 %. Incision/Wound:wound CDI no hematoma   Disposition: 01-Home or Self Care  Discharge Instructions    Diet - low sodium heart healthy    Complete by:  As directed   Increase activity slowly    Complete by:  As directed       Medication List    TAKE these medications   amLODipine 5 MG tablet Commonly known as:  NORVASC Take 10 mg by mouth daily.   cholecalciferol 1000 units tablet Commonly known as:  VITAMIN D Take 5,000 Units by mouth daily.   EPINEPHrine 0.3 mg/0.3 mL Soaj injection Commonly known as:  EPI-PEN Inject 0.3 mg into the muscle once.   meloxicam 15 MG tablet Commonly known as:  MOBIC Take 15 mg by mouth daily as needed for pain.   ondansetron 4 MG disintegrating tablet Commonly known as:  ZOFRAN-ODT Take 1 tablet (4 mg total) by mouth every 6 (six) hours as needed for nausea.        Signed: Jamaiya Tunnell A. 01/18/2016, 8:54 AM

## 2016-01-18 NOTE — Discharge Instructions (Signed)
Bulb Drain Home Care A bulb drain consists of a thin rubber tube and a soft, round bulb that creates a gentle suction. The rubber tube is placed in the area where you had surgery. A bulb is attached to the end of the tube that is outside the body. The bulb drain removes excess fluid that normally builds up in a surgical wound after surgery. The color and amount of fluid will vary. Immediately after surgery, the fluid is bright red and is a little thicker than water. It may gradually change to a yellow or pink color and become more thin and water-like. When the amount decreases to about 1 or 2 tbsp in 24 hours, your health care provider will usually remove it. DAILY CARE  Keep the bulb flat (compressed) at all times, except while emptying it. The flatness creates suction. You can flatten the bulb by squeezing it firmly in the middle and then closing the cap.  Keep sites where the tube enters the skin dry and covered with a bandage (dressing).  Secure the tube 1-2 in (2.5-5.1 cm) below the insertion sites to keep it from pulling on your stitches. The tube is stitched in place and will not slip out.  Secure the bulb as directed by your health care provider.  For the first 3 days after surgery, there usually is more fluid in the bulb. Empty the bulb whenever it becomes half full because the bulb does not create enough suction if it is too full. The bulb could also overflow. Write down how much fluid you remove each time you empty your drain. Add up the amount removed in 24 hours.  Empty the bulb at the same time every day once the amount of fluid decreases and you only need to empty it once a day. Write down the amounts and the 24-hour totals to give to your health care provider. This helps your health care provider know when the tubes can be removed. EMPTYING THE BULB DRAIN Before emptying the bulb, get a measuring cup, a piece of paper and a pen, and wash your hands.  Gently run your fingers down the  tube (stripping) to empty any drainage from the tubing into the bulb. This may need to be done several times a day to clear the tubing of clots and tissue.  Open the bulb cap to release suction, which causes it to inflate. Do not touch the inside of the cap.  Gently run your fingers down the tube (stripping) to empty any drainage from the tubing into the bulb.  Hold the cap out of the way, and pour fluid into the measuring cup.   Squeeze the bulb to provide suction.  Replace the cap.   Check the tape that holds the tube to your skin. If it is becoming loose, you can remove the loose piece of tape and apply a new one. Then, pin the bulb to your shirt.   Write down the amount of fluid you emptied out. Write down the date and each time you emptied your bulb drain. (If there are 2 bulbs, note the amount of drainage from each bulb and keep the totals separate. Your health care provider will want to know the total amounts for each drain and which tube is draining more.)   Flush the fluid down the toilet and wash your hands.   Call your health care provider once you have less than 2 tbsp of fluid collecting in the bulb drain every 24 hours. If  there is drainage around the tube site, change dressings and keep the area dry. Cleanse around tube with sterile saline and place dry gauze around site. This gauze should be changed when it is soiled. If it stays clean and unsoiled, it should still be changed daily.  SEEK MEDICAL CARE IF:  Your drainage has a bad smell or is cloudy.   You have a fever.   Your drainage is increasing instead of decreasing.   Your tube fell out.   You have redness or swelling around the tube site.   You have drainage from a surgical wound.   Your bulb drain will not stay flat after you empty it.  MAKE SURE YOU:   Understand these instructions.  Will watch your condition.  Will get help right away if you are not doing well or get worse.   This  information is not intended to replace advice given to you by your health care provider. Make sure you discuss any questions you have with your health care provider.   Document Released: 05/23/2000 Document Revised: 06/16/2014 Document Reviewed: 12/13/2014 Elsevier Interactive Patient Education 2016 [employer]. [facility], [place] [phone]  MASTECTOMY: POST OP INSTRUCTIONS  Always review your discharge instruction sheet given to you by the facility where your surgery was performed. IF YOU HAVE DISABILITY OR FAMILY LEAVE FORMS, YOU MUST BRING THEM TO THE OFFICE FOR PROCESSING.   DO NOT GIVE THEM TO YOUR DOCTOR. A prescription for pain medication may be given to you upon discharge.  Take your pain medication as prescribed, if needed.  If narcotic pain medicine is not needed, then you may take acetaminophen (Tylenol) or ibuprofen (Advil) as needed. 1. Take your usually prescribed medications unless otherwise directed. 2. If you need a refill on your pain medication, please contact your pharmacy.  They will contact our office to request authorization.  Prescriptions will not be filled after 5pm or on week-ends. 3. You should follow a light diet the first few days after arrival home, such as soup and crackers, etc.  Resume your normal diet the day after surgery. 4. Most patients will experience some swelling and bruising on the chest and underarm.  Ice packs will help.  Swelling and bruising can take several days to resolve.  5. It is common to experience some constipation if taking pain medication after surgery.  Increasing fluid intake and taking a stool softener (such as Colace) will usually help or prevent this problem from occurring.  A mild laxative (Milk of Magnesia or Miralax) should be taken according to package instructions if there are no bowel movements after 48 hours. 6. Unless discharge instructions indicate otherwise, leave your bandage dry and in place until your  next appointment in 3-5 days.  You may take a limited sponge bath.  No tube baths or showers until the drains are removed.  You may have steri-strips (small skin tapes) in place directly over the incision.  These strips should be left on the skin for 7-10 days.  If your surgeon used skin glue on the incision, you may shower in 24 hours.  The glue will flake off over the next 2-3 weeks.  Any sutures or staples will be removed at the office during your follow-up visit. 7. DRAINS:  If you have drains in place, it is important to keep a list of the amount of drainage produced each day in your drains.  Before leaving the hospital, you should be instructed on drain care.  Call  our office if you have any questions about your drains. 8. ACTIVITIES:  You may resume regular (light) daily activities beginning the next day--such as daily self-care, walking, climbing stairs--gradually increasing activities as tolerated.  You may have sexual intercourse when it is comfortable.  Refrain from any heavy lifting or straining until approved by your doctor. a. You may drive when you are no longer taking prescription pain medication, you can comfortably wear a seatbelt, and you can safely maneuver your car and apply brakes. b. RETURN TO WORK:  __________________________________________________________ 9. You should see your doctor in the office for a follow-up appointment approximately 3-5 days after your surgery.  Your doctors nurse will typically make your follow-up appointment when she calls you with your pathology report.  Expect your pathology report 2-3 business days after your surgery.  You may call to check if you do not hear from [place] after three days.   10. OTHER INSTRUCTIONS: ______________________________________________________________________________________________ ____________________________________________________________________________________________ WHEN TO CALL YOUR DOCTOR: 1. Fever over 101.0 2. Nausea  and/or vomiting 3. Extreme swelling or bruising 4. Continued bleeding from incision. 5. Increased pain, redness, or drainage from the incision. The clinic staff is available to answer your questions during regular business hours.  Please dont hesitate to call and ask to speak to one of the nurses for clinical concerns.  If you have a medical emergency, go to the nearest emergency room or call 911.  A surgeon from [facility] is always on call at the hospital. [address], [place], [place], [place]  [address] ? [address], [place], [place]   [address] [phone] ? [phone] ? FAX [phone] Web site: [email or web address]

## 2016-01-18 NOTE — Telephone Encounter (Signed)
Discussed with Diane Barnes that her genetic test result was negative for any known pathogenic mutations within any of 20 genes on the Breast/Ovarian Cancer Panel through [facility] [employer].  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  Discussed that we treat this just like a negative test result until it gets updated by the lab and reviewed why we do that.  Recommended genetic testing for her other sisters who have had breast cancer.  Also discussed that family studies to look for this VUS in her sisters with breast cancer may be offered free by [facility].  If they would be interested, I could follow-up with the lab to see if they are eligible.  Diane Barnes will discuss this with her sisters.  Discussed that women in the family are still at an increased risk for breast cancer, simply due to the family history.  She should continue to follow her doctors recommendations for future cancer screening.  Her nieces can begin mammogram screening at age 34, or earlier, as recommended by a doctor.  Diane Barnes would like a copy of her test results and a results letter, and I let her know I would send that out to her next week.  She is doing well following her surgery yesterday.  She knows she is welcome to call with any questions.

## 2016-01-18 NOTE — Progress Notes (Signed)
AVS given to patient. IV removed. Transportation arranged with Husband. Understanding demonstrated. Belongings packed.

## 2016-01-22 ENCOUNTER — Ambulatory Visit: Payer: Self-pay | Admitting: Genetic Counselor

## 2016-01-22 DIAGNOSIS — Z809 Family history of malignant neoplasm, unspecified: Secondary | ICD-10-CM

## 2016-01-22 DIAGNOSIS — Z853 Personal history of malignant neoplasm of breast: Secondary | ICD-10-CM

## 2016-01-22 DIAGNOSIS — C50411 Malignant neoplasm of upper-outer quadrant of right female breast: Secondary | ICD-10-CM

## 2016-01-22 DIAGNOSIS — Z1379 Encounter for other screening for genetic and chromosomal anomalies: Secondary | ICD-10-CM

## 2016-01-22 DIAGNOSIS — Z803 Family history of malignant neoplasm of breast: Secondary | ICD-10-CM

## 2016-02-05 ENCOUNTER — Encounter: Payer: Self-pay | Admitting: Genetic Counselor

## 2016-02-14 ENCOUNTER — Encounter: Payer: Self-pay | Admitting: Genetic Counselor

## 2016-02-14 ENCOUNTER — Other Ambulatory Visit: Payer: Self-pay

## 2016-02-14 DIAGNOSIS — C50911 Malignant neoplasm of unspecified site of right female breast: Secondary | ICD-10-CM

## 2016-02-15 ENCOUNTER — Other Ambulatory Visit: Payer: Managed Care, Other (non HMO)

## 2016-02-15 ENCOUNTER — Ambulatory Visit (HOSPITAL_BASED_OUTPATIENT_CLINIC_OR_DEPARTMENT_OTHER): Payer: Managed Care, Other (non HMO) | Admitting: Hematology & Oncology

## 2016-02-15 ENCOUNTER — Encounter: Payer: Self-pay | Admitting: Hematology & Oncology

## 2016-02-15 VITALS — BP 145/73 | HR 64 | Temp 98.0°F | Resp 16 | Ht 64.0 in | Wt 282.0 lb

## 2016-02-15 DIAGNOSIS — C50911 Malignant neoplasm of unspecified site of right female breast: Secondary | ICD-10-CM

## 2016-02-15 DIAGNOSIS — Z170 Estrogen receptor positive status [ER+]: Secondary | ICD-10-CM

## 2016-02-15 DIAGNOSIS — C50411 Malignant neoplasm of upper-outer quadrant of right female breast: Secondary | ICD-10-CM

## 2016-02-17 DIAGNOSIS — Z1379 Encounter for other screening for genetic and chromosomal anomalies: Secondary | ICD-10-CM | POA: Insufficient documentation

## 2016-02-17 NOTE — Progress Notes (Signed)
GENETIC TEST RESULT  HPI: Diane Barnes was previously seen in the [facility] due to a personal history of bilateral and multiple primary breast cancers, a family history of breast and other cancers, and concerns regarding a hereditary predisposition to cancer. Please refer to our prior cancer genetics clinic note from January 03, 2016 for more information regarding Diane Barnes's medical, social and family histories, and our assessment and recommendations, at the time. Diane Barnes recent genetic test results were disclosed to her, as were recommendations warranted by these results. These results and recommendations are discussed in more detail below.  GENETIC TEST RESULTS: At the time of Diane Barnes's visit on 01/03/16, we recommended she pursue genetic testing of the 20-gene Breast/Ovarian Cancer Panel through [employer].  The Breast/Ovarian Cancer Panel offered by [facility] Diane Cairo, MD) includes sequencing and deletion/duplication analysis for the following 19 genes:  ATM, BARD1, BRCA1, BRCA2, BRIP1, CDH1, CHEK2, FANCC, MLH1, MSH2, MSH6, NBN, PALB2, PMS2, PTEN, RAD51C, RAD51D, TP53, and XRCC2.  This panel also includes deletion/duplication analysis (without sequencing) for one gene, EPCAM.  Those results are now back, the report date for which is January 17, 2016.  Genetic testing was normal, and did not reveal a known deleterious mutation in these genes.  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  The test report will be scanned into EPIC and will be located under the Results Review tab in the Pathology>Molecular Pathology section.   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 At this time, it is unknown if this VUS is associated with an increased risk for cancer or if this is a normal finding. Since this VUS result is uncertain, it cannot help guide screening  recommendations, and family members should not be tested for this VUS to help define their own cancer risks.  Also, we all have variants within our genes that make [place] unique individuals--most of these variants are benign.  Thus, we treat this VUS as a negative result.   With time, we suspect the lab will reclassify this variant and when they do, we will try to re-contact Diane Barnes to discuss the reclassification further.  We also encouraged Diane Barnes to contact us in a year or two to obtain an update on the status of this VUS.  We discussed with Diane Barnes that since the current genetic testing is not perfect, it is possible there may be a gene mutation in one of these genes that current testing cannot detect, but that chance is small. We also discussed, that it is possible that another gene that has not yet been discovered, or that we have not yet tested, is responsible for the cancer diagnoses in the family, and it is, therefore, important to remain in touch with cancer genetics in the future so that we can continue to offer Diane Barnes the most up-to-date genetic testing.   CANCER SCREENING RECOMMENDATIONS: We still do not have an explanation for the personal and family history of breast cancer.  Given Diane Barnes's personal and family histories, we must interpret these negative results with some caution.  Families with features suggestive of hereditary risk for cancer tend to have multiple family members with cancer, diagnoses in multiple generations and diagnoses before the age of 67. Diane Barnes family exhibits some of these features. Thus this result may simply reflect our current inability to detect all mutations within these  genes or there may be a different gene that has not yet been discovered or tested. There is also a chance that there could be a genetic mutation in the family that Diane Barnes herself just did not inherit.  Further testing of affected family members would be helpful for further  clarification of the personal and familial cancer risks.  In the meantime, Diane Barnes should continue to follow her doctors' recommendations for future cancer screening.  RECOMMENDATIONS FOR FAMILY MEMBERS: Women in this family are at an increased risk of developing breast cancer, over the general population risk, simply due to the family history of breast cancer. We recommended women in the close family (Diane Barnes and Barnes) have a yearly mammogram beginning at age 59 (49 years younger than her first diagnosis of breast cancer), an annual clinical breast exam, and perform monthly breast self-exams. Women in this family should also have a gynecological exam as recommended by their primary provider. All family members should have a colonoscopy by age 43.  Based on Diane Barnes's family history, we recommended her Barnes, who have been diagnosed with breast cancers, have genetic counseling and testing. Diane Barnes sister who was diagnosed with thyroid cancer at 25 may also be eligible for genetic testing. Diane Barnes will let us know if we can be of any assistance in coordinating genetic counseling and/or testing for these family members.    FOLLOW-UP: Lastly, we discussed with Diane Barnes that cancer genetics is a rapidly advancing field and it is possible that new genetic tests will be appropriate for her and/or her family members in the future. We encouraged her to remain in contact with cancer genetics on an annual basis so we can update her personal and family histories and let her know of advances in cancer genetics that may benefit this family.   Our contact number was provided. Diane Barnes questions were answered to her satisfaction, and she knows she is welcome to call us at anytime with additional questions or concerns.   Diane Luz, MS, [facility] Certified Genetic Counselor [email or web address] Phone: [phone]

## 2016-02-19 NOTE — Progress Notes (Signed)
Hematology and Oncology Follow Up Visit  Diane Barnes [id number] [birth year] 54 y.o. 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 She has he had her mastectomy back in early August. She had a mastectomy on the right side on August 10. The pathology report ([id number]) shows a multifocal invasive lobular carcinoma. There were 3 areas. The largest measured 1.1 cm. She had 2 negative lymph nodes. All margins were negative. As such, she is a stage I (T1cN0M0)  invasive lobular carcinoma of the right breast.  We will go ahead and send off an Oncotype assay on her tumor. I would have to think that this will be a low score.   She is already said that she will not take chemotherapy. I'm trying to convince her to take aromatase inhibitor therapy. She is tired of taking pills. I told her that aromatase inhibitor therapy is the standard of care for all post menopausal early stage breast cancer that is estrogen positive. I told her that it is just 1 pill a day. I think that the side effects would be minimal.  Again, she just does not want take any more pills.  She has recovered nicely from the surgery. She has a little bit of swelling over on the right anterior chest wall. She's had no right arm swelling. She has decent range of motion of the right shoulder.  She has no change in bowel or bladder habits. She's had no hot flashes. She's had no cough. She's had no bleeding.  Overall, her performance status is ECOG 0.  Medications:  Current Outpatient Prescriptions:  .  amLODipine (NORVASC) 5 MG tablet, Take 10 mg by mouth daily.  , Disp: , Rfl:  .  cholecalciferol (VITAMIN D) 1000 units tablet, Take 5,000 Units by mouth daily. , Disp: , Rfl:  .  EPINEPHrine 0.3 mg/0.3 mL IJ SOAJ injection, Inject 0.3 mg into the muscle once.,  Disp: , Rfl:   Allergies:  Allergies  Allergen Reactions  . Sesame Oil Anaphylaxis    Past Medical History, Surgical history, Social history, and Family History were reviewed and updated.  Review of Systems: As above   Physical Exam:  height is '5\' 4"'$  (1.626 m) and weight is 282 lb (127.9 kg). Her oral temperature is 98 F (36.7 C). Her blood pressure is 145/73 (abnormal) and her pulse is 64. Her respiration is 16.   Wt Readings from Last 3 Encounters:  02/15/16 282 lb (127.9 kg)  01/17/16 275 lb 3.2 oz (124.8 kg)  01/10/16 278 lb (126.1 kg)     Well-developed and well-nourished African-American female in no obvious distress. Head and neck exam shows no ocular or oral lesions. There are no palpable cervical or supraclavicular lymph nodes. Lungs are clear bilaterally. Cardiac exam regular rate and rhythm with no murmurs, rubs or bruits. Breast exam shows low breast with no masses, edema or erythema. There is no left axillary adenopathy. Right chest wall shows healing mastectomy. There is no erythema or warmth. There may be a slight swelling at the mastectomy site. She has no right axillary adenopathy. Abdomen is soft but obese. She has good bowel sounds. There is no fluid wave. There is no palpable liver or spleen tip. Back exam shows no tenderness over the spine, ribs or hips. Extremities shows  no clubbing, cyanosis or edema. Neurological exam shows no focal neurological deficits.   Lab Results  Component Value Date   WBC 16.3 (H) 01/18/2016   HGB 13.0 01/18/2016   HCT 39.8 01/18/2016   MCV 87.7 01/18/2016   PLT 261 01/18/2016     Chemistry      Component Value Date/Time   NA 137 01/18/2016 0259   NA 140 11/21/2015 1450   K 4.3 01/18/2016 0259   K 4.0 11/21/2015 1450   CL 104 01/18/2016 0259   CL 100 11/21/2015 1450   CO2 25 01/18/2016 0259   CO2 30 (H) 11/21/2015 1450   BUN 11 01/18/2016 0259   BUN 17 11/21/2015 1450   CREATININE 0.92 01/18/2016 0259   CREATININE 1.04  (H) 11/21/2015 1450      Component Value Date/Time   CALCIUM 9.0 01/18/2016 0259   CALCIUM 9.7 11/21/2015 1450   ALKPHOS 173 (H) 11/21/2015 1450   AST 12 11/21/2015 1450   ALT 8 11/21/2015 1450   BILITOT 0.2 11/21/2015 1450         Impression and Plan: Diane Barnes is A 55 year old Afro-American female. 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 She N1 radiation and chemotherapy. She had a lumpectomy.  She now has a new breast cancer in the right breast. This is an invasive lobular carcinoma. She had a mastectomy.  Again, I think the least we can do for her is the aromatase inhibitor therapy. I really believe that she would benefit from this area did however, she just does not want to take anymore pills. Again, I tried to get her to understand that this is just 1 pill a day. It is truly standard of care. They will improve her prognosis.  We will see what the Oncotype assay shows. Again I would have to think that this would be a low score.  At the end of the meeting, she says she will think about things.  I will like to get her back in about 4 weeks or so. I think this would be reasonable.  I spent about 40 minutes with her today.   Volanda Napoleon, MD 9/12/20178:29 AM

## 2016-03-07 ENCOUNTER — Encounter (HOSPITAL_COMMUNITY): Payer: Self-pay

## 2016-03-28 ENCOUNTER — Ambulatory Visit (HOSPITAL_BASED_OUTPATIENT_CLINIC_OR_DEPARTMENT_OTHER): Payer: Managed Care, Other (non HMO) | Admitting: Hematology & Oncology

## 2016-03-28 ENCOUNTER — Other Ambulatory Visit (HOSPITAL_BASED_OUTPATIENT_CLINIC_OR_DEPARTMENT_OTHER): Payer: Managed Care, Other (non HMO)

## 2016-03-28 ENCOUNTER — Encounter: Payer: Self-pay | Admitting: Hematology & Oncology

## 2016-03-28 VITALS — BP 158/92 | HR 67 | Temp 98.0°F | Resp 16 | Ht 64.0 in | Wt 285.0 lb

## 2016-03-28 DIAGNOSIS — C50911 Malignant neoplasm of unspecified site of right female breast: Secondary | ICD-10-CM

## 2016-03-28 DIAGNOSIS — C50411 Malignant neoplasm of upper-outer quadrant of right female breast: Secondary | ICD-10-CM

## 2016-03-28 DIAGNOSIS — Z170 Estrogen receptor positive status [ER+]: Secondary | ICD-10-CM

## 2016-03-28 LAB — CBC WITH DIFFERENTIAL (CANCER CENTER ONLY)
BASO#: 0 10*3/uL (ref 0.0–0.2)
BASO%: 0.2 % (ref 0.0–2.0)
EOS ABS: 0 10*3/uL (ref 0.0–0.5)
EOS%: 0.2 % (ref 0.0–7.0)
HCT: 38.9 % (ref 34.8–46.6)
HEMOGLOBIN: 13 g/dL (ref 11.6–15.9)
LYMPH#: 3.6 10*3/uL — ABNORMAL HIGH (ref 0.9–3.3)
LYMPH%: 29.1 % (ref 14.0–48.0)
MCH: 29 pg (ref 26.0–34.0)
MCHC: 33.4 g/dL (ref 32.0–36.0)
MCV: 87 fL (ref 81–101)
MONO#: 1.2 10*3/uL — ABNORMAL HIGH (ref 0.1–0.9)
MONO%: 9.8 % (ref 0.0–13.0)
NEUT%: 60.7 % (ref 39.6–80.0)
NEUTROS ABS: 7.5 10*3/uL — AB (ref 1.5–6.5)
PLATELETS: 270 10*3/uL (ref 145–400)
RBC: 4.49 10*6/uL (ref 3.70–5.32)
RDW: 13.4 % (ref 11.1–15.7)
WBC: 12.4 10*3/uL — AB (ref 3.9–10.0)

## 2016-03-28 LAB — CMP (CANCER CENTER ONLY)
ALT(SGPT): 9 U/L — ABNORMAL LOW (ref 10–47)
AST: 17 U/L (ref 11–38)
Albumin: 3.7 g/dL (ref 3.3–5.5)
Alkaline Phosphatase: 162 U/L — ABNORMAL HIGH (ref 26–84)
BILIRUBIN TOTAL: 0.6 mg/dL (ref 0.20–1.60)
BUN: 14 mg/dL (ref 7–22)
CHLORIDE: 102 meq/L (ref 98–108)
CO2: 31 meq/L (ref 18–33)
CREATININE: 1.1 mg/dL (ref 0.6–1.2)
Calcium: 9.2 mg/dL (ref 8.0–10.3)
GLUCOSE: 94 mg/dL (ref 73–118)
Potassium: 4.2 mEq/L (ref 3.3–4.7)
SODIUM: 141 meq/L (ref 128–145)
Total Protein: 7.5 g/dL (ref 6.4–8.1)

## 2016-03-28 NOTE — Progress Notes (Signed)
Hematology and Oncology Follow Up Visit  Zahira Brummond [id number] Jan 25, 1961 55 y.o. 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 Lumadue is back for follow-up. She has he had her mastectomy back in early August. She had a mastectomy on the right side on August 10. The pathology report ([id number]) shows a multifocal invasive lobular carcinoma. There were 3 areas. The largest measured 1.1 cm. She had 2 negative lymph nodes. All margins were negative. As such, she is a stage I (T1cN0M0)  invasive lobular carcinoma of the right breast.  The Oncotype score is only 11. This gives her a 7% chance of recurrence with tamoxifen at 10 years. However, she just refuses to go on any pills. I told her that going on an aromatase inhibitor is the standard of care. I told her that with doing nothing, the risk of her cancer coming back might go up to 10-12%.  She clearly understands the increased risk that she is taking by not having an aromatase inhibitor as her post op therapy.  There is just a lot of stress in her life. She is not happy about all the stress.  She still is not too happy with the result of the mastectomy. I told her that this will eventually smooths out. It will not look as "ragged".   She has had no fever. She's had no change in bowel or bladder habits. She's had no nausea or vomiting.  Overall, her performance status is ECOG 0.  Medications:  Current Outpatient Prescriptions:  .  amLODipine (NORVASC) 5 MG tablet, Take 10 mg by mouth daily.  , Disp: , Rfl:  .  cholecalciferol (VITAMIN D) 1000 units tablet, Take 5,000 Units by mouth daily. , Disp: , Rfl:  .  EPINEPHrine 0.3 mg/0.3 mL IJ SOAJ injection, Inject 0.3 mg into the muscle once., Disp: , Rfl:  .  RED YEAST RICE EXTRACT PO, Take by mouth., Disp: , Rfl:    Allergies:  Allergies  Allergen Reactions  . Sesame Oil Anaphylaxis    Past Medical History, Surgical history, Social history, and Family History were reviewed and updated.  Review of Systems: As above   Physical Exam:  height is '5\' 4"'$  (1.626 m) and weight is 285 lb (129.3 kg). Her oral temperature is 98 F (36.7 C). Her blood pressure is 158/92 (abnormal) and her pulse is 67. Her respiration is 16.   Wt Readings from Last 3 Encounters:  03/28/16 285 lb (129.3 kg)  02/15/16 282 lb (127.9 kg)  01/17/16 275 lb 3.2 oz (124.8 kg)     Well-developed and well-nourished African-American female in no obvious distress. Head and neck exam shows no ocular or oral lesions. There are no palpable cervical or supraclavicular lymph nodes. Lungs are clear bilaterally. Cardiac exam regular rate and rhythm with no murmurs, rubs or bruits. Breast exam shows low breast with no masses, edema or erythema. There is no left axillary adenopathy. Right chest wall shows healing mastectomy. There is no erythema or warmth. There may be a slight swelling at the mastectomy site. She has no right axillary adenopathy. Abdomen is soft but obese. She has good bowel sounds. There is no fluid wave. There is no palpable liver or spleen tip. Back exam shows no tenderness over the spine, ribs or hips.  Extremities shows no clubbing, cyanosis or edema. Neurological exam shows no focal neurological deficits.   Lab Results  Component Value Date   WBC 12.4 (H) 03/28/2016   HGB 13.0 03/28/2016   HCT 38.9 03/28/2016   MCV 87 03/28/2016   PLT 270 03/28/2016     Chemistry      Component Value Date/Time   NA 141 03/28/2016 1119   K 4.2 03/28/2016 1119   CL 102 03/28/2016 1119   CO2 31 03/28/2016 1119   BUN 14 03/28/2016 1119   CREATININE 1.1 03/28/2016 1119      Component Value Date/Time   CALCIUM 9.2 03/28/2016 1119   ALKPHOS 162 (H) 03/28/2016 1119   AST 17 03/28/2016 1119   ALT 9 (L) 03/28/2016 1119   BILITOT  0.60 03/28/2016 1119         Impression and Plan: Ms. Renfro is a 55 year old Afro-American female. 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 She underwent radiation and chemotherapy. She had a lumpectomy.  She now has a new breast cancer in the right breast. This is an invasive lobular carcinoma. She had a mastectomy.  Again, she just will not take any antiestrogen therapy. She fully realizes the risk that she is taking. She is comfortable with this risk.  I will plan to see her back in 3 months. I will continue to ask her if she wants to take any therapy.   I spent about 40 minutes with her today.   Volanda Napoleon, MD 10/20/201712:54 PM

## 2016-06-26 ENCOUNTER — Other Ambulatory Visit: Payer: Managed Care, Other (non HMO)

## 2016-06-26 ENCOUNTER — Ambulatory Visit: Payer: Managed Care, Other (non HMO) | Admitting: Hematology & Oncology

## 2016-08-30 ENCOUNTER — Encounter (HOSPITAL_COMMUNITY): Payer: Self-pay

## 2016-10-03 ENCOUNTER — Other Ambulatory Visit (HOSPITAL_BASED_OUTPATIENT_CLINIC_OR_DEPARTMENT_OTHER): Payer: Managed Care, Other (non HMO)

## 2016-10-03 ENCOUNTER — Ambulatory Visit (HOSPITAL_BASED_OUTPATIENT_CLINIC_OR_DEPARTMENT_OTHER): Payer: Managed Care, Other (non HMO) | Admitting: Hematology & Oncology

## 2016-10-03 VITALS — BP 164/83 | HR 72 | Temp 98.2°F | Resp 16 | Wt 270.1 lb

## 2016-10-03 DIAGNOSIS — C50911 Malignant neoplasm of unspecified site of right female breast: Secondary | ICD-10-CM

## 2016-10-03 DIAGNOSIS — C50411 Malignant neoplasm of upper-outer quadrant of right female breast: Secondary | ICD-10-CM

## 2016-10-03 DIAGNOSIS — Z170 Estrogen receptor positive status [ER+]: Principal | ICD-10-CM

## 2016-10-03 DIAGNOSIS — M818 Other osteoporosis without current pathological fracture: Secondary | ICD-10-CM

## 2016-10-03 LAB — CBC WITH DIFFERENTIAL (CANCER CENTER ONLY)
BASO#: 0 10*3/uL (ref 0.0–0.2)
BASO%: 0.3 % (ref 0.0–2.0)
EOS ABS: 0.1 10*3/uL (ref 0.0–0.5)
EOS%: 1.2 % (ref 0.0–7.0)
HCT: 41.2 % (ref 34.8–46.6)
HGB: 13.6 g/dL (ref 11.6–15.9)
LYMPH#: 2.5 10*3/uL (ref 0.9–3.3)
LYMPH%: 32.6 % (ref 14.0–48.0)
MCH: 29.1 pg (ref 26.0–34.0)
MCHC: 33 g/dL (ref 32.0–36.0)
MCV: 88 fL (ref 81–101)
MONO#: 0.4 10*3/uL (ref 0.1–0.9)
MONO%: 5.3 % (ref 0.0–13.0)
NEUT#: 4.6 10*3/uL (ref 1.5–6.5)
NEUT%: 60.6 % (ref 39.6–80.0)
PLATELETS: 259 10*3/uL (ref 145–400)
RBC: 4.68 10*6/uL (ref 3.70–5.32)
RDW: 13.7 % (ref 11.1–15.7)
WBC: 7.6 10*3/uL (ref 3.9–10.0)

## 2016-10-03 LAB — CMP (CANCER CENTER ONLY)
ALT(SGPT): 14 U/L (ref 10–47)
AST: 20 U/L (ref 11–38)
Albumin: 3.7 g/dL (ref 3.3–5.5)
Alkaline Phosphatase: 147 U/L — ABNORMAL HIGH (ref 26–84)
BUN: 9 mg/dL (ref 7–22)
CHLORIDE: 101 meq/L (ref 98–108)
CO2: 29 meq/L (ref 18–33)
CREATININE: 0.9 mg/dL (ref 0.6–1.2)
Calcium: 9.6 mg/dL (ref 8.0–10.3)
Glucose, Bld: 105 mg/dL (ref 73–118)
Potassium: 3.7 mEq/L (ref 3.3–4.7)
SODIUM: 141 meq/L (ref 128–145)
TOTAL PROTEIN: 7.6 g/dL (ref 6.4–8.1)
Total Bilirubin: 0.8 mg/dl (ref 0.20–1.60)

## 2016-10-03 NOTE — Progress Notes (Signed)
Hematology and Oncology Follow Up Visit  Diane Barnes [id number] [birth year] 56 y.o. 10/03/2016   Principle Diagnosis:   Multifocal Invasive Lobular Ca of the RIGHT breast  - Stage I (T1cN0M)) - ER+/PR+/HER-2 (-) -- Oncotype Score is 11  Current Therapy:    S/p mastectomy - 01/2016     Interim History:  Diane Barnes is back for follow-up. She has he had her mastectomy back in early August. She had a mastectomy on the right side on August 10. The pathology report (Diane Barnes) shows a multifocal invasive lobular carcinoma. There were 3 areas. The largest measured 1.1 cm. She had 2 negative lymph nodes. All margins were negative. As such, she is a stage I (T1cN0M0)  invasive lobular carcinoma of the right breast.  The Oncotype score is only 11. This gives her a 7% chance of recurrence with tamoxifen at 10 years. However, she just refuses to go on any pills. I told her that going on an aromatase inhibitor is the standard of care. I told her that with doing nothing, the risk of her cancer coming back might go up to 10-12%.  She clearly understands the increased risk that she is taking by not having an aromatase inhibitor as her post op therapy.  There is just a lot of stress in her life. She is not happy about all the stress.  She is not happy about the cosmetic outcome of her mastectomy. She says that she is "uneven". I talked to her about reconstructive surgery. She has seen Dr. Aleene Barnes him in the past. I think Dr. Aleene Barnes him would definitely be a great person for her to see.  We have not seen her for 6 month. She did not make her appointment for 3 months. Her sister up in [place] has stage IV colon cancer. We talked a lot about this.  She wants to go to [place] next year. She's worried about the risk of infection. I told her that from my point of view, I think that there should be a problem as long as she is on the proper antibiotics and get the proper vaccines. Her family doctor can be in  charge of this.  Overall, her performance status is ECOG 0.  Medications:  Current Outpatient Prescriptions:  .  amLODipine (NORVASC) 5 MG tablet, Take 10 mg by mouth daily.  , Disp: , Rfl:  .  cholecalciferol (VITAMIN D) 1000 units tablet, Take 5,000 Units by mouth daily. , Disp: , Rfl:  .  EPINEPHrine 0.3 mg/0.3 mL IJ SOAJ injection, Inject 0.3 mg into the muscle once., Disp: , Rfl:  .  RED YEAST RICE EXTRACT PO, Take by mouth., Disp: , Rfl:   Allergies:  Allergies  Allergen Reactions  . Sesame Oil Anaphylaxis    Past Medical History, Surgical history, Social history, and Family History were reviewed and updated.  Review of Systems: As above   Physical Exam:  weight is 270 lb 1.9 oz (122.5 kg). Her oral temperature is 98.2 F (36.8 C). Her blood pressure is 164/83 (abnormal) and her pulse is 72. Her respiration is 16 and oxygen saturation is 100%.   Wt Readings from Last 3 Encounters:  10/03/16 270 lb 1.9 oz (122.5 kg)  03/28/16 285 lb (129.3 kg)  02/15/16 282 lb (127.9 kg)     Well-developed and well-nourished African-American female in no obvious distress. Head and neck exam shows no ocular or oral lesions. There are no palpable cervical or supraclavicular lymph nodes. Lungs are clear bilaterally.  Cardiac exam regular rate and rhythm with no murmurs, rubs or bruits. Breast exam shows left chest wall  with no masses, edema or erythema. She has a well-healed mastectomy scar. There is no left axillary adenopathy. Right chest wall shows healing mastectomy. There is no erythema or warmth. There may be a slight swelling at the mastectomy site. She has no right axillary adenopathy. Abdomen is soft but obese. She has good bowel sounds. There is no fluid wave. There is no palpable liver or spleen tip. Back exam shows no tenderness over the spine, ribs or hips. Extremities shows no clubbing, cyanosis or edema. Neurological exam shows no focal neurological deficits.   Lab Results    Component Value Date   WBC 7.6 10/03/2016   HGB 13.6 10/03/2016   HCT 41.2 10/03/2016   MCV 88 10/03/2016   PLT 259 10/03/2016     Chemistry      Component Value Date/Time   NA 141 03/28/2016 1119   K 4.2 03/28/2016 1119   CL 102 03/28/2016 1119   CO2 31 03/28/2016 1119   BUN 14 03/28/2016 1119   CREATININE 1.1 03/28/2016 1119      Component Value Date/Time   CALCIUM 9.2 03/28/2016 1119   ALKPHOS 162 (H) 03/28/2016 1119   AST 17 03/28/2016 1119   ALT 9 (L) 03/28/2016 1119   BILITOT 0.60 03/28/2016 1119         Impression and Plan: Diane Barnes is a 55 year old Afro-American female. 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 She underwent radiation and chemotherapy. She had a mastectomy.  She now has a new breast cancer in the right breast. This is an invasive lobular carcinoma. She had a mastectomy. This was in August 2017.  Again, she just will not take any antiestrogen therapy. She fully realizes the risk that she is taking. She is comfortable with this risk.  I will plan to see her back in 4 months. I will continue to ask her if she wants to take any therapy.   Hopefully, she will make an appointment to see Dr. Marla Barnes.  I spent about 40 minutes with her today.   Volanda Napoleon, MD 4/27/201810:06 AM

## 2016-12-14 IMAGING — MR MR BREAST BILAT WO/W CM
8 of 12 series · 30 of 48 positions shown · IV contrast (20ml Multihance)
Comparison: Mammograms dated 11/01/2015, 10/15/2015, 10/01/2015,
06/21/2013.

CLINICAL DATA: The patient has a history of left breast malignancy
in 0144 status post mastectomy. Recent right breast screening
mammography demonstrated a small mass located within the lateral
right breast. Ultrasound-guided core biopsy demonstrated a grade 1-2
invasive mammary carcinoma in this region. In addition, a adjacent
smaller nodule was seen in this region and suspicious calcifications
were also noted within the lower inner quadrant of the right breast.
The patient is referred for pre treatment breast MRI.

LABS:  Renal profile was performed at [HOSPITAL] [REDACTED] Creatinine = 0.8. GFR = 90.
EXAM:
BILATERAL BREAST MRI WITH AND WITHOUT CONTRAST
TECHNIQUE: Multiplanar, multisequence MR images of both breasts were obtained
prior to and following the intravenous administration of 20 ml of
MultiHance.

[Series 2: t2_tirm_tra ipat (a-p) · axial · 3.0mm · 0.74mm/px · 1 of 57 slices shown]
[im 1/57]
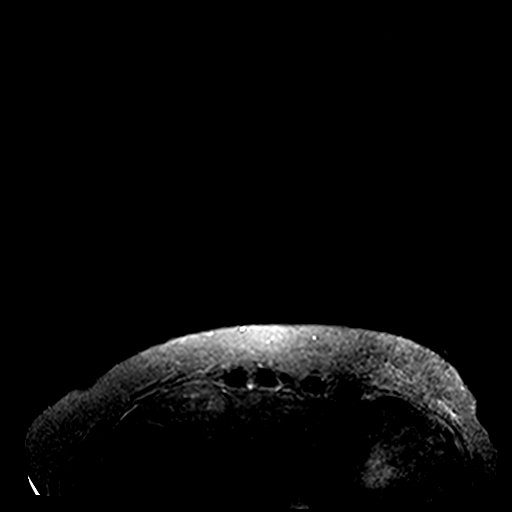

[Series 3: fl3d pre-cm no · axial · non-contrast · 1.2mm · 0.99mm/px · z∈[-85,+87]mm · 5 of 144 slices shown]
[im 1/144]
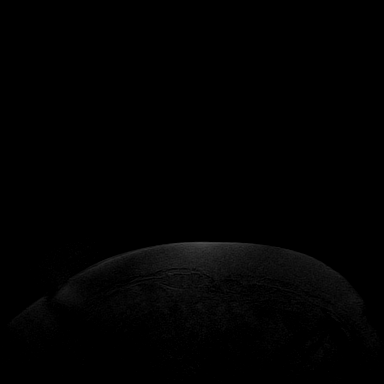
[im 36/144]
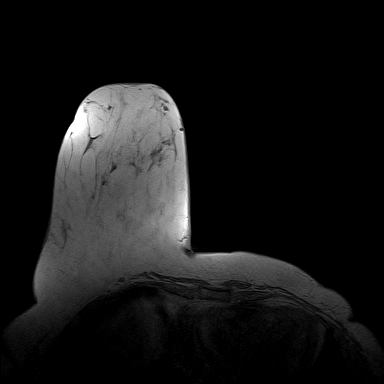
[im 72/144]
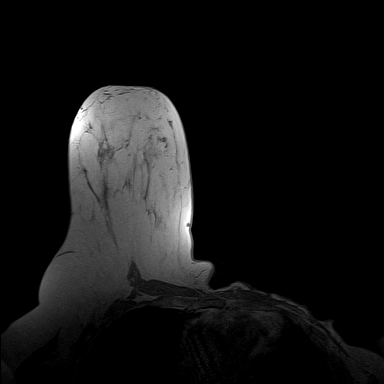
[im 108/144]
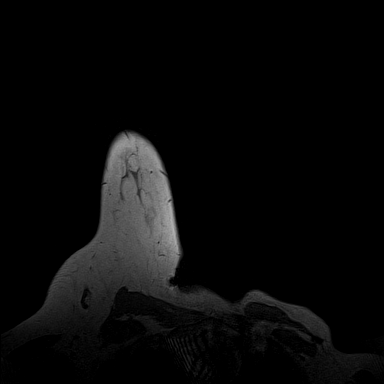
[im 144/144]
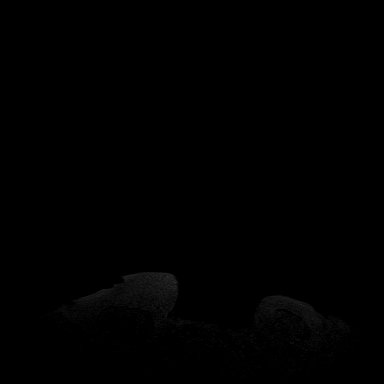

[Series 4: fl3d pre-cm · axial · non-contrast · 1.2mm · 0.99mm/px · z∈[-85,+87]mm · 5 of 144 slices shown]
[im 1/144]
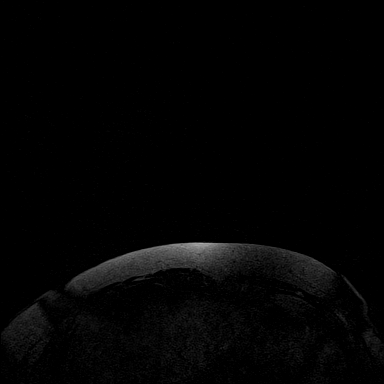
[im 36/144]
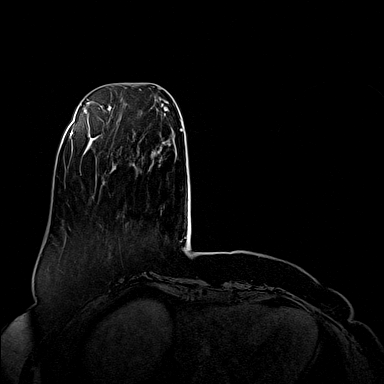
[im 72/144]
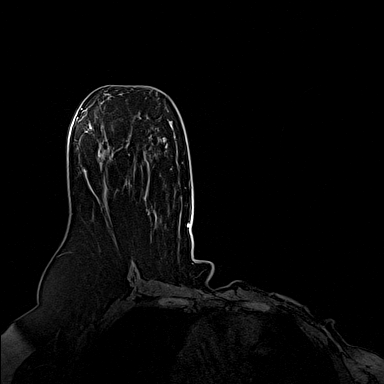
[im 108/144]
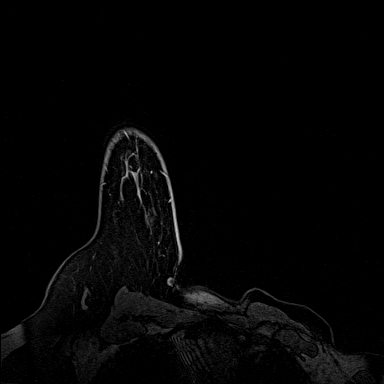
[im 144/144]
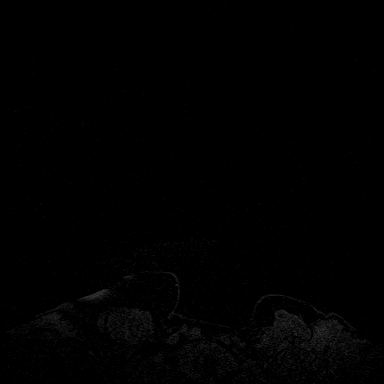

[Series 5: fl3d post-cm 20 · axial · 1.2mm · 0.99mm/px · z∈[-85,+87]mm · 5 of 144 slices shown (1 of 3)]
[im 1/144]
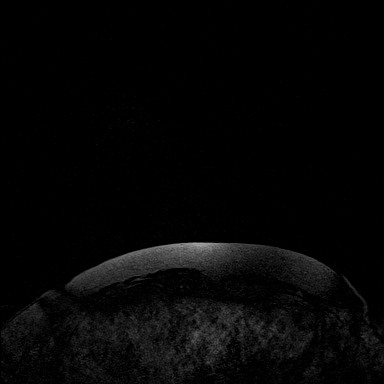
[im 36/144]
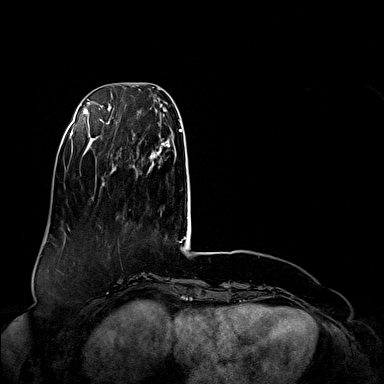
[im 72/144]
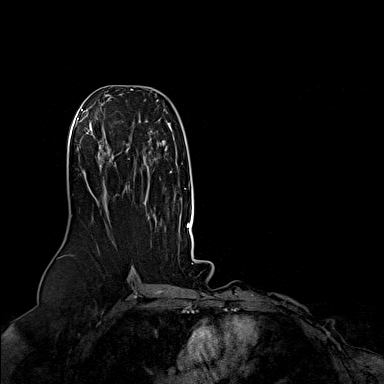
[im 108/144]
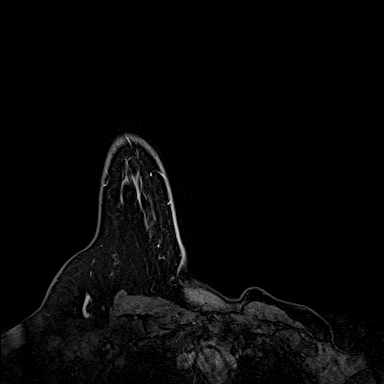
[im 144/144]
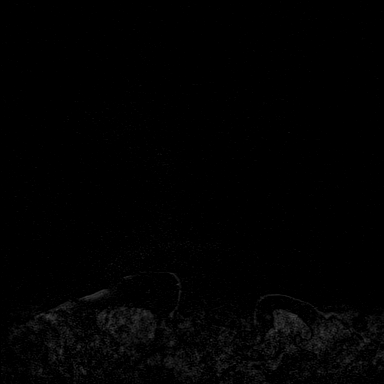

[Series 6: fl3d post-cm 20 · axial · 1.2mm · 0.99mm/px · z∈[-85,+87]mm · 5 of 144 slices shown (2 of 3)]
[im 1/144]
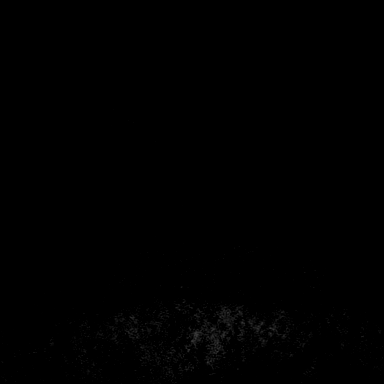
[im 36/144]
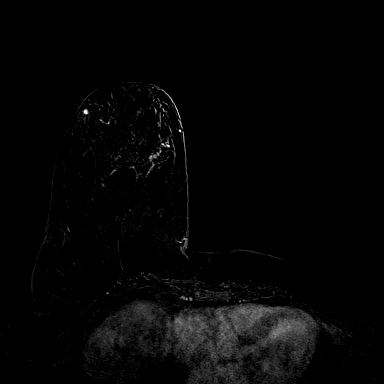
[im 72/144]
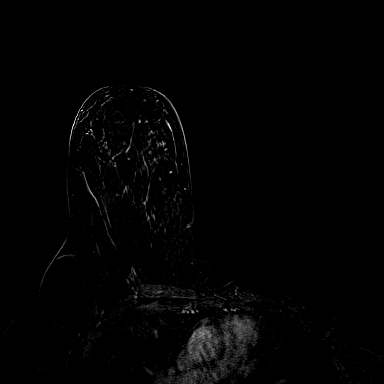
[im 108/144]
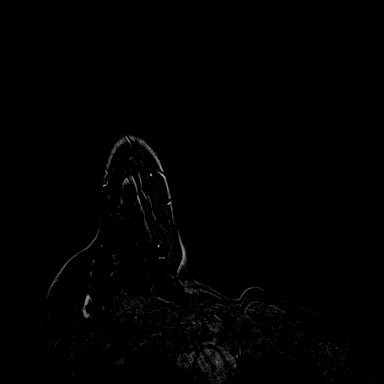
[im 144/144]
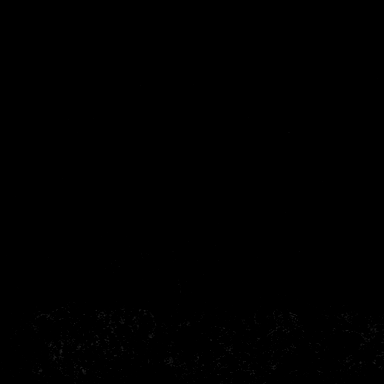

[Series 7: fl3d post-cm 20 · axial · 172.8mm · 0.99mm/px · 1 of 1 slices shown (3 of 3)]
[im 1/1]
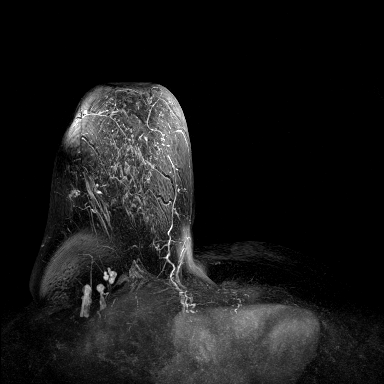

[Series 8: fl3d post-cm 3min · axial · 1.2mm · 0.99mm/px · z∈[-85,+87]mm · 6 of 144 slices shown]
[im 1/144]
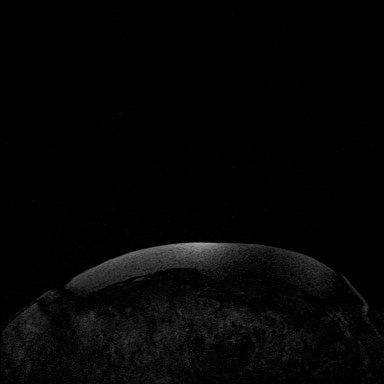
[im 29/144]
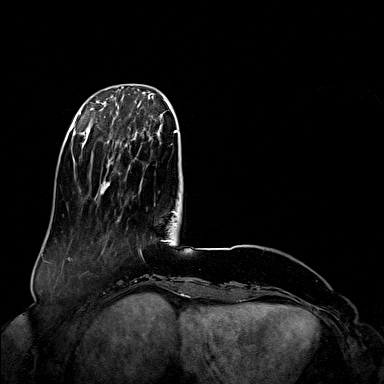
[im 58/144]
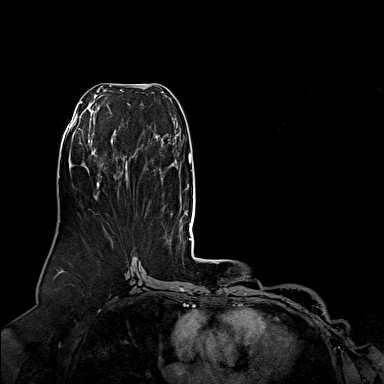
[im 86/144]
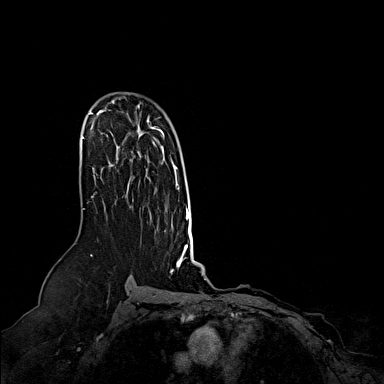
[im 115/144]
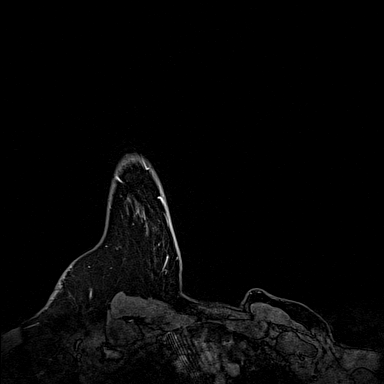
[im 144/144]
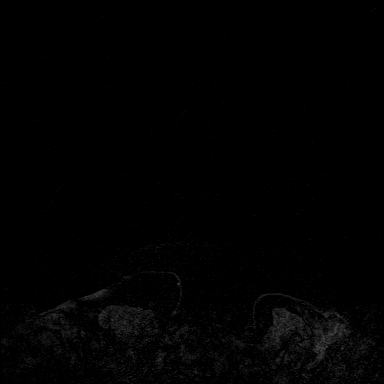

[Series 9: fl3d post-cm 3min_sub · axial · 1.2mm · 0.99mm/px · z∈[-85,-51]mm · 2 of 144 slices shown]
[im 1/144]
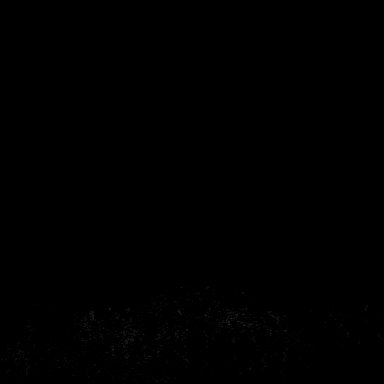
[im 29/144]
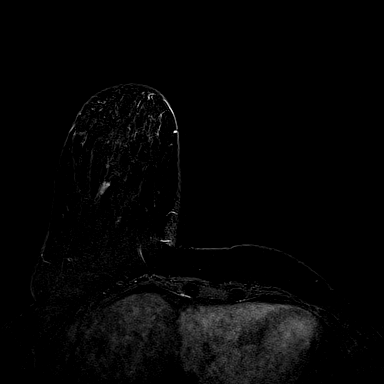

[30 of 48 positions shown; findings below may reference images not displayed]

THREE-DIMENSIONAL MR IMAGE RENDERING ON INDEPENDENT WORKSTATION:

Three-dimensional MR images were rendered by post-processing of the
original MR data on an independent workstation. The
three-dimensional MR images were interpreted, and findings are
reported in the following complete MRI report for this study. Three
dimensional images were evaluated at the independent [employer]
workstation
FINDINGS: Breast composition: b. Scattered fibroglandular tissue.

Background parenchymal enhancement: Moderate.

Right breast: There is an irregular enhancing mass located within
the right breast at the 9:30 o'clock position middle depth with
washout enhancement kinetics and adjacent signal void corresponding
to the recently diagnosed invasive mammary carcinoma. This measures
1.2 x 0.9 x 0.8 cm in size. Located 1.8 cm superior and slightly
medial to this is a smaller 6 x 4 by 5 mm irregular enhancing mass
worrisome for a small satellite malignancy. Also, a third 4 mm
enhancing mass is located 3 mm anterior and superior to this smaller
mass (2.2 cm from the known cancer).

There is also an enhancing mass with a mixture of plateau and
washout kinetics measuring 8 x 7 x 7 mm in size located within the
right breast anterior [DATE] at the 9 o'clock position which is an
indeterminate mass.

Finally, there is an area of non mass enhancement measuring 4.0 x
1.5 x 1.0 cm in size located within the lower inner quadrant of the
right breast in the area of suspicious calcifications noted on the
recent mammogram. This is suspicious for possible DCIS.

Left breast: There has been a previous left mastectomy.

Lymph nodes: No abnormal appearing lymph nodes.

Ancillary findings:  None.
IMPRESSION: 1. 1.2 cm irregular enhancing mass within the right breast at the
9:30 o'clock position corresponding to the recently diagnosed
invasive mammary carcinoma.
2. Two smaller satellite nodules located anterior and slightly
medial to the known malignancy worrisome for satellite malignancies.
These measure 6 and 4 mm in size and are approximately 2 cm from the
larger mass.
3. 8 mm enhancing indeterminate mass located within the anterior [DATE]
of the right breast 9 o'clock position.
4. 4.0 x 1.5 x 1.0 cm area of non mass enhancement located within
the lower inner quadrant of the right breast in the area of the
suspicious calcifications noted on mammography.

RECOMMENDATION:
If breast conservation is planned, I recommend stereotactic biopsy
of the calcifications within the lower inner quadrant of the right
breast which corresponds to the area of non mass enhancement noted
on the recent breast MRI.

BI-RADS CATEGORY  4: Suspicious.

## 2017-01-13 ENCOUNTER — Ambulatory Visit: Payer: Managed Care, Other (non HMO) | Admitting: Hematology & Oncology

## 2017-01-13 ENCOUNTER — Other Ambulatory Visit: Payer: Managed Care, Other (non HMO)

## 2019-03-08 ENCOUNTER — Encounter: Payer: Self-pay | Admitting: Gynecology

## 2020-09-25 ENCOUNTER — Other Ambulatory Visit: Payer: Self-pay

## 2020-09-25 ENCOUNTER — Emergency Department (HOSPITAL_COMMUNITY)
Admission: EM | Admit: 2020-09-25 | Discharge: 2020-09-26 | Disposition: A | Discharge status: Home / Self Care | Payer: Managed Care, Other (non HMO) | Attending: Emergency Medicine | Admitting: Emergency Medicine

## 2020-09-25 ENCOUNTER — Encounter (HOSPITAL_COMMUNITY): Payer: Self-pay

## 2020-09-25 ENCOUNTER — Emergency Department (HOSPITAL_COMMUNITY): Payer: Managed Care, Other (non HMO)

## 2020-09-25 DIAGNOSIS — I10 Essential (primary) hypertension: Secondary | ICD-10-CM | POA: Diagnosis not present

## 2020-09-25 DIAGNOSIS — Z79899 Other long term (current) drug therapy: Secondary | ICD-10-CM | POA: Diagnosis not present

## 2020-09-25 DIAGNOSIS — Z853 Personal history of malignant neoplasm of breast: Secondary | ICD-10-CM | POA: Diagnosis not present

## 2020-09-25 DIAGNOSIS — Y92410 Unspecified street and highway as the place of occurrence of the external cause: Secondary | ICD-10-CM | POA: Insufficient documentation

## 2020-09-25 DIAGNOSIS — M25511 Pain in right shoulder: Secondary | ICD-10-CM | POA: Diagnosis not present

## 2020-09-25 DIAGNOSIS — R0789 Other chest pain: Secondary | ICD-10-CM | POA: Diagnosis present
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
## 2020-09-26 NOTE — Discharge Instructions (Signed)
You may use over-the-counter Motrin (Ibuprofen), Acetaminophen (Tylenol), topical muscle creams such as SalonPas, Icy Hot, Bengay, etc. Please stretch, apply ice or heat (whichever helps), and have massage therapy for additional assistance.  
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
## 2020-10-02 ENCOUNTER — Ambulatory Visit
Admission: EM | Admit: 2020-10-02 | Discharge: 2020-10-02 | Disposition: A | Discharge status: Home / Self Care | Payer: Managed Care, Other (non HMO)

## 2020-10-02 DIAGNOSIS — S301XXA Contusion of abdominal wall, initial encounter: Secondary | ICD-10-CM
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
## 2020-10-02 NOTE — Discharge Instructions (Signed)
Tylenol and ibuprofen for pain and swelling/inflammation Alternate ice and heat Follow-up if not improving or worsening
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
